# Patient Record
Sex: Male | Born: 2000 | Race: White | Hispanic: No | Marital: Single | Smoking: Never smoker
Health system: Southern US, Community
[De-identification: ages and names within clinical notes are randomized; demographics above are authoritative.]

## PROBLEM LIST (undated history)

## (undated) DIAGNOSIS — R519 Headache, unspecified: Secondary | ICD-10-CM

## (undated) DIAGNOSIS — F32A Depression, unspecified: Secondary | ICD-10-CM

## (undated) DIAGNOSIS — J189 Pneumonia, unspecified organism: Secondary | ICD-10-CM

## (undated) DIAGNOSIS — J45909 Unspecified asthma, uncomplicated: Secondary | ICD-10-CM

## (undated) DIAGNOSIS — F419 Anxiety disorder, unspecified: Secondary | ICD-10-CM

## (undated) DIAGNOSIS — R0600 Dyspnea, unspecified: Secondary | ICD-10-CM

## (undated) HISTORY — PX: OTHER SURGICAL HISTORY: SHX169

---
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
## 2019-09-28 ENCOUNTER — Other Ambulatory Visit: Payer: Self-pay | Admitting: Pediatrics

## 2019-09-28 ENCOUNTER — Other Ambulatory Visit: Payer: Self-pay

## 2019-09-28 ENCOUNTER — Ambulatory Visit
Admission: RE | Admit: 2019-09-28 | Discharge: 2019-09-28 | Disposition: A | Discharge status: Home / Self Care | Payer: Medicaid Other | Attending: Pediatrics | Admitting: Pediatrics

## 2019-09-28 ENCOUNTER — Ambulatory Visit
Admission: RE | Admit: 2019-09-28 | Discharge: 2019-09-28 | Disposition: A | Discharge status: Home / Self Care | Payer: Medicaid Other | Source: Ambulatory Visit | Attending: Pediatrics | Admitting: Pediatrics

## 2019-09-28 DIAGNOSIS — R0600 Dyspnea, unspecified: Secondary | ICD-10-CM

## 2019-09-28 DIAGNOSIS — J4531 Mild persistent asthma with (acute) exacerbation: Secondary | ICD-10-CM

## 2019-12-02 ENCOUNTER — Institutional Professional Consult (permissible substitution): Payer: Medicaid Other | Admitting: Pulmonary Disease

## 2020-07-13 ENCOUNTER — Ambulatory Visit
Admission: RE | Admit: 2020-07-13 | Discharge: 2020-07-13 | Disposition: A | Discharge status: Home / Self Care | Payer: Medicaid Other | Source: Ambulatory Visit | Attending: Pediatrics | Admitting: Pediatrics

## 2020-07-13 ENCOUNTER — Ambulatory Visit
Admission: RE | Admit: 2020-07-13 | Discharge: 2020-07-13 | Disposition: A | Discharge status: Home / Self Care | Payer: Medicaid Other | Attending: Pediatrics | Admitting: Pediatrics

## 2020-07-13 ENCOUNTER — Other Ambulatory Visit: Payer: Self-pay | Admitting: Pediatrics

## 2020-07-13 DIAGNOSIS — M79644 Pain in right finger(s): Secondary | ICD-10-CM | POA: Insufficient documentation

## 2021-11-26 ENCOUNTER — Other Ambulatory Visit: Payer: Self-pay

## 2021-11-26 ENCOUNTER — Ambulatory Visit
Admission: EM | Admit: 2021-11-26 | Discharge: 2021-11-26 | Disposition: A | Discharge status: Home / Self Care | Payer: Medicaid Other | Attending: Emergency Medicine | Admitting: Emergency Medicine

## 2021-11-26 DIAGNOSIS — N451 Epididymitis: Secondary | ICD-10-CM | POA: Diagnosis not present

## 2021-11-26 MED ORDER — DOXYCYCLINE HYCLATE 100 MG PO CAPS: 100.0000 mg | ORAL_CAPSULE | Freq: Two times a day (BID) | ORAL | 0 refills | Status: DC

## 2021-11-26 MED ORDER — IBUPROFEN 600 MG PO TABS: 600.0000 mg | ORAL_TABLET | Freq: Four times a day (QID) | ORAL | 0 refills | Status: DC | PRN

## 2021-11-26 NOTE — Discharge Instructions (Signed)
Take the doxycycline twice daily for 7 days for treatment of your epididymitis.  Use over-the-counter Tylenol and ibuprofen as needed for discomfort.  Wear supportive underwear or jockstrap to support your scrotum and prevent further irritation of your epididymis.  If your symptoms do not improve you need to follow-up with urology.  Narcotic information has been included in your discharge instructions.  

## 2021-11-26 NOTE — ED Provider Notes (Signed)
[facility]    CSN: [id number] Arrival date & time: 11/26/21  1357      History   Chief Complaint Chief Complaint  Patient presents with   Testicle Pain    HPI EVERT WENRICH is a 21 y.o. male.   HPI  21 year old male here for evaluation of right testicular pain.  He noticed an odd feeling in his spermatic cord behind his right testicle 3 days ago.  He states that he had a short episode of pain that lasted approximately 3 hours and resolved earlier today.  He denies any scrotal swelling, painful urination, urinary urgency or frequency, or penile discharge.  He is not sexually active and not concerned about STIs.  He just wanted to be checked because he felt a hard ball on the cord leading to his testicle.  History reviewed. No pertinent past medical history.  There are no problems to display for this patient.   History reviewed. No pertinent surgical history.     Home Medications    Prior to Admission medications   Medication Sig Start Date End Date Taking? Authorizing Provider  doxycycline (VIBRAMYCIN) 100 MG capsule Take 1 capsule (100 mg total) by mouth 2 (two) times daily. 11/26/21  Yes Becky Augusta, NP  ibuprofen (ADVIL) 600 MG tablet Take 1 tablet (600 mg total) by mouth every 6 (six) hours as needed. 11/26/21  Yes Becky Augusta, NP    Family History History reviewed. No pertinent family history.  Social History Social History   Tobacco Use   Smoking status: Never   Smokeless tobacco: Never  Vaping Use   Vaping Use: Never used  Substance Use Topics   Alcohol use: Yes   Drug use: Yes     Allergies   Peanut (diagnostic) and Penicillins   Review of Systems Review of Systems  Genitourinary:  Negative for dysuria, frequency, penile discharge, penile pain, penile swelling, scrotal swelling, testicular pain and urgency.       "Odd sensation" in the cord leading to the right testicle.    Physical Exam Triage Vital Signs ED Triage Vitals   Enc Vitals Group     BP 11/26/21 1626 134/85     Pulse Rate 11/26/21 1626 69     Resp 11/26/21 1626 18     Temp 11/26/21 1626 99 F (37.2 C)     Temp Source 11/26/21 1626 Oral     SpO2 11/26/21 1626 99 %     Weight 11/26/21 1623 145 lb (65.8 kg)     Height 11/26/21 1623 5\' 11"  (1.803 m)     Head Circumference --      Peak Flow --      Pain Score 11/26/21 1622 4     Pain Loc --      Pain Edu? --      Excl. in GC? --    No data found.  Updated Vital Signs BP 134/85 (BP Location: Left Arm)    Pulse 69    Temp 99 F (37.2 C) (Oral)    Resp 18    Ht 5\' 11"  (1.803 m)    Wt 145 lb (65.8 kg)    SpO2 99%    BMI 20.22 kg/m   Visual Acuity Right Eye Distance:   Left Eye Distance:   Bilateral Distance:    Right Eye Near:   Left Eye Near:    Bilateral Near:     Physical Exam Vitals and nursing note reviewed.  Constitutional:  General: He is not in acute distress.    Appearance: Normal appearance. He is not ill-appearing.  HENT:     Head: Normocephalic and atraumatic.  Genitourinary:    Penis: Normal.      Testes: Normal.  Skin:    General: Skin is warm and dry.     Capillary Refill: Capillary refill takes less than 2 seconds.     Findings: No erythema or rash.  Neurological:     General: No focal deficit present.     Mental Status: He is alert and oriented to person, place, and time.  Psychiatric:        Mood and Affect: Mood normal.        Behavior: Behavior normal.        Thought Content: Thought content normal.        Judgment: Judgment normal.     UC Treatments / Results  Labs (all labs ordered are listed, but only abnormal results are displayed) Labs Reviewed - No data to display  EKG   Radiology No results found.  Procedures Procedures (including critical care time)  Medications Ordered in UC Medications - No data to display  Initial Impression / Assessment and Plan / UC Course  I have reviewed the triage vital signs and the nursing  notes.  Pertinent labs & imaging results that were available during my care of the patient were reviewed by me and considered in my medical decision making (see chart for details).  Patient is a nontoxic-appearing 21 year old male here for evaluation of right testicular issues as outlined in HPI above.  He states that he is had an odd sensation for the last couple of days that ended up being a 3 to 4-hour period of pain today.  The pain has resolved.  He states the pain is more in the back of the testicle and in the spermatic cord on the right.  He states he felt a ball along the cord earlier today.  He denies any other urinary complaints and is not currently sexually active.  On exam both of the patient's testicles are normal in size and smooth in texture.  There is some mild tenderness with palpation of the right testicle.  Both the left and right epididymis are inflamed and mildly tender to palpation.  There is no bleeding when palpating the spermatic cord of either testicle.  No redness or swelling of the scrotum.  Patient exam is consistent with epididymitis.  We will place patient on doxycycline twice daily for 7 days and given prescription ibuprofen 6 mg to take every 6 hours as needed for pain.  Have also discussed with him wearing supportive underwear to take the tension off of the spermatic cord and the testicles so that he can rest and heal.   Final Clinical Impressions(s) / UC Diagnoses   Final diagnoses:  Epididymitis     Discharge Instructions      Take the doxycycline twice daily for 7 days for treatment of your epididymitis.  Use over-the-counter Tylenol and ibuprofen as needed for discomfort.  Wear supportive underwear or jockstrap to support your scrotum and prevent further irritation of your epididymis.  If your symptoms do not improve you need to follow-up with urology.  Narcotic information has been included in your discharge instructions.      ED Prescriptions      Medication Sig Dispense Auth. Provider   doxycycline (VIBRAMYCIN) 100 MG capsule Take 1 capsule (100 mg total) by mouth 2 (two) times  daily. 20 capsule Becky Augusta, NP   ibuprofen (ADVIL) 600 MG tablet Take 1 tablet (600 mg total) by mouth every 6 (six) hours as needed. 30 tablet Becky Augusta, NP      PDMP not reviewed this encounter.   Becky Augusta, NP 11/26/21 [phone]

## 2021-11-26 NOTE — ED Triage Notes (Addendum)
Pt c/o pain in his right testicle. Pt states that it is an "odd" feeling. Pt states that he checked his testicle today and felt a lump.

## 2021-12-17 ENCOUNTER — Other Ambulatory Visit (HOSPITAL_COMMUNITY): Payer: Self-pay | Admitting: Infectious Diseases

## 2021-12-17 ENCOUNTER — Other Ambulatory Visit: Payer: Self-pay | Admitting: Infectious Diseases

## 2021-12-17 DIAGNOSIS — N50812 Left testicular pain: Secondary | ICD-10-CM

## 2021-12-17 DIAGNOSIS — N50811 Right testicular pain: Secondary | ICD-10-CM

## 2021-12-17 DIAGNOSIS — Z0000 Encounter for general adult medical examination without abnormal findings: Secondary | ICD-10-CM

## 2021-12-18 ENCOUNTER — Other Ambulatory Visit: Payer: Self-pay

## 2021-12-18 ENCOUNTER — Ambulatory Visit: Payer: Medicaid Other

## 2021-12-18 ENCOUNTER — Ambulatory Visit
Admission: RE | Admit: 2021-12-18 | Discharge: 2021-12-18 | Disposition: A | Discharge status: Home / Self Care | Payer: Medicaid Other | Source: Ambulatory Visit | Attending: Infectious Diseases | Admitting: Infectious Diseases

## 2021-12-18 DIAGNOSIS — Z0000 Encounter for general adult medical examination without abnormal findings: Secondary | ICD-10-CM | POA: Diagnosis present

## 2021-12-18 DIAGNOSIS — N50811 Right testicular pain: Secondary | ICD-10-CM | POA: Insufficient documentation

## 2021-12-18 DIAGNOSIS — N50812 Left testicular pain: Secondary | ICD-10-CM | POA: Insufficient documentation

## 2021-12-24 ENCOUNTER — Other Ambulatory Visit: Payer: Self-pay | Admitting: *Deleted

## 2021-12-24 ENCOUNTER — Other Ambulatory Visit: Payer: Self-pay

## 2021-12-24 ENCOUNTER — Ambulatory Visit: Payer: Medicaid Other | Admitting: Urology

## 2021-12-24 ENCOUNTER — Other Ambulatory Visit
Admission: RE | Admit: 2021-12-24 | Discharge: 2021-12-24 | Disposition: A | Discharge status: Home / Self Care | Payer: Medicaid Other | Attending: Urology | Admitting: Urology

## 2021-12-24 ENCOUNTER — Encounter: Payer: Self-pay | Admitting: Urology

## 2021-12-24 VITALS — BP 115/69 | HR 60 | Ht 71.0 in | Wt 129.0 lb

## 2021-12-24 DIAGNOSIS — N50812 Left testicular pain: Secondary | ICD-10-CM

## 2021-12-24 DIAGNOSIS — N50811 Right testicular pain: Secondary | ICD-10-CM | POA: Diagnosis not present

## 2021-12-24 DIAGNOSIS — N5082 Scrotal pain: Secondary | ICD-10-CM | POA: Diagnosis not present

## 2021-12-24 LAB — URINALYSIS, COMPLETE (UACMP) WITH MICROSCOPIC
Bilirubin Urine: NEGATIVE
Glucose, UA: NEGATIVE mg/dL
Hgb urine dipstick: NEGATIVE
Ketones, ur: NEGATIVE mg/dL
Leukocytes,Ua: NEGATIVE
Nitrite: NEGATIVE
Protein, ur: NEGATIVE mg/dL
Specific Gravity, Urine: 1.01 (ref 1.005–1.030)
pH: 6 (ref 5.0–8.0)

## 2021-12-24 MED ORDER — CELECOXIB 200 MG PO CAPS: 200.0000 mg | ORAL_CAPSULE | Freq: Two times a day (BID) | ORAL | 0 refills | Status: DC

## 2021-12-24 NOTE — Progress Notes (Signed)
° °  12/24/21 2:25 PM   Ricardo Gibson Nov 11, 2000 [id number]  CC: Scrotal pain  HPI: Healthy 21 year old male who reports with a few weeks of scrotal pain.  This originally started on the right, but has moved back and forth from the left to the right.  He was seen in urgent care on 11/26/2021 and scrotal ultrasound was benign  and he was treated with doxycycline for possible epididymitis.  He has continued to have intermittent discomfort in the testicles.  There are no aggravating or alleviating factors.  Urinalysis today is benign  PMH: No past medical history on file.  Surgical History: No past surgical history on file.   Social History:  reports that he has never smoked. He has never been exposed to tobacco smoke. He has never used smokeless tobacco. He reports current alcohol use. He reports current drug use.  Physical Exam: BP 115/69    Pulse 60    Ht 5\' 11"  (1.803 m)    Wt 129 lb (58.5 kg)    BMI 17.99 kg/m    Constitutional:  Alert and oriented, No acute distress. Cardiovascular: No clubbing, cyanosis, or edema. Respiratory: Normal respiratory effort, no increased work of breathing. GI: Abdomen is soft, nontender, nondistended, no abdominal masses GU: Circumcised phallus with patent meatus, no lesions, testicles 20 cc and descended bilaterally, minimally tender, no masses  Laboratory Data: Reviewed, see HPI  Pertinent Imaging: I have personally viewed and interpreted the scrotal ultrasound showing normal-appearing testicles bilaterally with no masses or other abnormalities.  Assessment & Plan:   21 year old male with intermittent bilateral scrotal pain of unclear etiology.  Scrotal ultrasound and urinalysis benign, and was previously treated with a course of doxycycline for possible epididymitis without significant improvement.  We discussed possible causes including pelvic floor dysfunction, nerve irritation, or idiopathic.  I recommended Celebrex 200 mg twice daily x2  weeks, snug fitting underwear, and behavioral strategies discussed.  Return precautions reviewed   35, MD 12/24/2021  [facility] [address], [address] [place], [place] [id number]

## 2021-12-24 NOTE — Patient Instructions (Signed)
Pelvic Pain, Male Pelvic pain is pain in your lower abdomen, below your belly button and between your hips. The pain may start suddenly (be acute), keep coming back (recur), or last a long time (become chronic). Pelvic pain that lasts longer than six months is considered chronic. There are many possible causes of pelvic pain. Sometimes, the cause is not known. Pelvic pain may affect your: Prostate gland. Urinary system. Digestive tract. Musculoskeletal system. Strained muscles or ligaments may cause pelvic pain. Follow these instructions at home: Medicines Take over-the-counter and prescription medicines only as told by your health care provider. If you were prescribed an antibiotic medicine, take it as told by your health care provider. Do not stop taking the antibiotic even if you start to feel better. Managing pain, stiffness, and swelling  Take warm water baths (sitz baths). Sitz baths help with relaxing your pelvic floor muscles. For a sitz bath, the water only comes up to your hips and covers your buttocks. A sitz bath may done at home in a bathtub or with a portable sitz bath that fits over the toilet. If directed, apply heat to the affected area before you exercise. Use the heat source that your health care provider recommends, such as a moist heat pack or a heating pad. Place a towel between your skin and the heat source. Leave the heat on for 20-30 minutes. Remove the heat if your skin turns bright red. This is especially important if you are unable to feel pain, heat, or cold. You may have a greater risk of getting burned. General instructions Rest as told by your health care provider. Keep a journal of your pelvic pain. Write down: When the pain started. Where the pain is located. What seems to make the pain better or worse. Any symptoms you have along with the pain. Follow your treatment plan as told by your health care provider. This may include: Pelvic physical  therapy. Yoga, meditation, and exercise. Biofeedback. This process trains you to manage your body's response (physiological response) through breathing techniques and relaxation methods. You will work with a therapist while machines are used to monitor your physical symptoms. Acupuncture. This is a type of treatment that involves stimulating specific points on your body by inserting thin needles through your skin to treat pain. Keep all follow-up visits as told by your health care provider. This is important. Contact a health care provider if: Medicine does not help your pain. Your pain comes back. You have new symptoms. You have a fever or chills. You are constipated. You have blood in your urine or stool. You feel weak or light-headed. Get help right away if: You have sudden severe pain. Your pain steadily gets worse. You have severe pain along with fever, nausea, vomiting, or excessive sweating. Summary Pelvic pain is pain in your lower abdomen, below your belly button and between your hips. There are many possible causes of pelvic pain. Sometimes, the cause is not known. Take over-the-counter and prescription medicines only as told by your health care provider. If you were prescribed an antibiotic medicine, take it as told by your health care provider. Do not stop taking the antibiotic even if you start to feel better. Contact a health care provider if you have new or worsening symptoms. Get help right away if you have severe pain along with fever, nausea, vomiting, or excessive sweating. Keep all follow-up visits as told by your health care provider. This is important. This information is not intended to replace  advice given to you by your health care provider. Make sure you discuss any questions you have with your health care provider. Document Revised: 03/10/2018 Document Reviewed: 03/10/2018 Elsevier Patient Education  2022 [employer]  Pelvic Floor Dysfunction, Male   Pelvic  floor dysfunction (PFD) is a condition that results when the group of muscles and connective tissues that support the organs in the pelvis (pelvic floor muscles) do not work well. These muscles and their connections form a sling that supports the colon and bladder. In men, these muscles also support the prostate gland. PFD causes pelvic floor muscles to be too weak, too tight, or both. In PFD, muscle movements are not coordinated. This may cause bowel or bladder problems. It may also cause pain. What are the causes? This condition may be caused by an injury to the pelvic area or by a weakening of pelvic muscles. In many cases, the exact cause is not known. What increases the risk? The following factors may make you more likely to develop PFD: Having chronic bladder tissue inflammation (interstitial cystitis). Being an older person. Being overweight. History of radiation treatment for cancer in the pelvic region. Previous pelvic surgery, such as removal of the prostate gland (prostatectomy). What are the signs or symptoms? Symptoms of this condition vary and may include: Bladder symptoms, such as: Trouble starting urination and emptying the bladder. Frequent urinary tract infections. Leaking urine when coughing, laughing, or exercising (stress incontinence). Having to pass urine urgently or frequently. Pain when passing urine. Bowel symptoms, such as: Constipation. Urgent or frequent bowel movements. Incomplete bowel movements. Painful bowel movements. Leaking stool or gas. Unexplained genital or rectal pain. Genital or rectal muscle spasms. Low back pain. Sexual dysfunction, such as erectile dysfunction, premature ejaculation, or pain during or after sexual activity. How is this diagnosed? This condition is diagnosed based on: Your symptoms and medical history. A physical exam. During the exam, your health care provider may check your pelvic muscles for tightness, spasm, pain, or  weakness. This may include a rectal exam. In some cases, you may have diagnostic tests, such as: Electrical muscle function tests. Urine flow testing. X-ray tests of bowel function. Ultrasound of the pelvic organs. How is this treated? Treatment for this condition depends on your symptoms. Treatment options include: Physical therapy. This may include Kegel exercises to help relax or strengthen the pelvic floor muscles. Biofeedback. This type of therapy provides feedback on how tight your pelvic floor muscles are so that you can learn to control them. Massage therapy. A treatment that involves electrical stimulation of the pelvic floor muscles to help control pain (transcutaneous electrical nerve stimulation, or TENS). Sound wave therapy (ultrasound) to reduce muscle spasms. Medicines, such as: Muscle relaxants. Bladder control medicines. Surgery to reconstruct or support pelvic floor muscles may be an option if other treatments do not help. Follow these instructions at home: Activity Do your usual activities as told by your health care provider. Ask your health care provider if you should modify any activities. Do pelvic floor strengthening or relaxing exercises at home as told by your physical therapist. Lifestyle Maintain a healthy weight. Eat foods that are high in fiber, such as beans, whole grains, and fresh fruits and vegetables. Limit foods that are high in fat and processed sugars, such as fried or sweet foods. Manage stress with relaxation techniques such as yoga or meditation. General instructions If you have problems with leakage: Use absorbable pads or wear padded underwear. Wash your genital and anal area  frequently with mild soap. Keep your genital and anal area as clean and dry as possible. Ask your health care provider if you should try a barrier cream to prevent skin irritation. Take warm baths to relieve pelvic muscle tension or spasms. Take over-the-counter and  prescription medicines only as told by your health care provider. Keep all follow-up visits. How is this prevented? The cause of PFD is not always known, but there are a few things you can do to reduce the risk of developing this condition, including: Staying at a healthy weight. Getting regular exercise. Managing stress. Contact a health care provider if: Your symptoms are not improving with home care. You have signs or symptoms of PFD that get worse. You develop new signs or symptoms. You have signs of a urinary tract infection, such as: Fever. Chills. Increased urinary frequency. A burning feeling when urinating. You have not had a bowel movement in 3 days (constipation). Summary Pelvic floor dysfunction results when the muscles and connective tissues in your pelvic floor do not work well. These muscles and their connections form a sling that supports your colon and bladder. In men, these muscles also support the prostate gland. PFD may be caused by an injury to the pelvic area or by a weakening of pelvic muscles. PFD causes pelvic floor muscles to be too weak, too tight, or a combination of both. Symptoms may vary from person to person. In most cases, PFD can be treated with physical therapies and medicines. Surgery may be an option if other treatments do not help. This information is not intended to replace advice given to you by your health care provider. Make sure you discuss any questions you have with your health care provider. Document Revised: 02/27/2021 Document Reviewed: 02/27/2021 Elsevier Patient Education  2022 [employer].

## 2022-01-07 ENCOUNTER — Other Ambulatory Visit: Payer: Self-pay

## 2022-01-07 DIAGNOSIS — N50812 Left testicular pain: Secondary | ICD-10-CM

## 2022-01-07 DIAGNOSIS — N50811 Right testicular pain: Secondary | ICD-10-CM

## 2022-01-07 DIAGNOSIS — N5082 Scrotal pain: Secondary | ICD-10-CM

## 2022-01-07 NOTE — Telephone Encounter (Signed)
Please order CT abdomen and pelvis with contrast to evaluate for other etiologies of his multiple symptoms.  We will call with CT results ? ?Legrand Rams, MD ?01/07/2022 ? ?

## 2022-01-21 ENCOUNTER — Ambulatory Visit: Payer: Medicaid Other | Admitting: Urology

## 2022-01-21 ENCOUNTER — Other Ambulatory Visit: Payer: Self-pay

## 2022-01-21 ENCOUNTER — Encounter: Payer: Self-pay | Admitting: Urology

## 2022-01-21 VITALS — BP 130/71 | HR 73 | Ht 71.0 in | Wt 127.6 lb

## 2022-01-21 DIAGNOSIS — M6289 Other specified disorders of muscle: Secondary | ICD-10-CM

## 2022-01-21 MED ORDER — CELECOXIB 200 MG PO CAPS: 200.0000 mg | ORAL_CAPSULE | Freq: Two times a day (BID) | ORAL | 0 refills | Status: DC

## 2022-01-21 NOTE — Patient Instructions (Signed)
Pelvic Floor Dysfunction, Male ?  ?Pelvic floor dysfunction (PFD) is a condition that results when the group of muscles and connective tissues that support the organs in the pelvis (pelvic floor muscles) do not work well. These muscles and their connections form a sling that supports the colon and bladder. In men, these muscles also support the prostate gland. ?PFD causes pelvic floor muscles to be too weak, too tight, or both. In PFD, muscle movements are not coordinated. This may cause bowel or bladder problems. It may also cause pain. ?What are the causes? ?This condition may be caused by an injury to the pelvic area or by a weakening of pelvic muscles. In many cases, the exact cause is not known. ?What increases the risk? ?The following factors may make you more likely to develop PFD: ?Having chronic bladder tissue inflammation (interstitial cystitis). ?Being an older person. ?Being overweight. ?History of radiation treatment for cancer in the pelvic region. ?Previous pelvic surgery, such as removal of the prostate gland (prostatectomy). ?What are the signs or symptoms? ?Symptoms of this condition vary and may include: ?Bladder symptoms, such as: ?Trouble starting urination and emptying the bladder. ?Frequent urinary tract infections. ?Leaking urine when coughing, laughing, or exercising (stress incontinence). ?Having to pass urine urgently or frequently. ?Pain when passing urine. ?Bowel symptoms, such as: ?Constipation. ?Urgent or frequent bowel movements. ?Incomplete bowel movements. ?Painful bowel movements. ?Leaking stool or gas. ?Unexplained genital or rectal pain. ?Genital or rectal muscle spasms. ?Low back pain. ?Sexual dysfunction, such as erectile dysfunction, premature ejaculation, or pain during or after sexual activity. ?How is this diagnosed? ?This condition is diagnosed based on: ?Your symptoms and medical history. ?A physical exam. During the exam, your health care provider may check your pelvic  muscles for tightness, spasm, pain, or weakness. This may include a rectal exam. ?In some cases, you may have diagnostic tests, such as: ?Electrical muscle function tests. ?Urine flow testing. ?X-ray tests of bowel function. ?Ultrasound of the pelvic organs. ?How is this treated? ?Treatment for this condition depends on your symptoms. Treatment options include: ?Physical therapy. This may include Kegel exercises to help relax or strengthen the pelvic floor muscles. ?Biofeedback. This type of therapy provides feedback on how tight your pelvic floor muscles are so that you can learn to control them. ?Massage therapy. ?A treatment that involves electrical stimulation of the pelvic floor muscles to help control pain (transcutaneous electrical nerve stimulation, or TENS). ?Sound wave therapy (ultrasound) to reduce muscle spasms. ?Follow these instructions at home: ?Activity ?Do your usual activities as told by your health care provider. Ask your health care provider if you should modify any activities. ?Do pelvic floor strengthening or relaxing exercises at home as told by your physical therapist. ?Lifestyle ?Maintain a healthy weight. ?Eat foods that are high in fiber, such as beans, whole grains, and fresh fruits and vegetables. ?Limit foods that are high in fat and processed sugars, such as fried or sweet foods. ?Manage stress with relaxation techniques such as yoga or meditation. ?General instructions ?If you have problems with leakage: ?Use absorbable pads or wear padded underwear. ?Wash your genital and anal area frequently with mild soap. ?Keep your genital and anal area as clean and dry as possible. ?Ask your health care provider if you should try a barrier cream to prevent skin irritation. ?Take warm baths to relieve pelvic muscle tension or spasms. ?Take over-the-counter and prescription medicines only as told by your health care provider. ?Keep all follow-up visits. ?How is this prevented? ?  The cause of PFD is  not always known, but there are a few things you can do to reduce the risk of developing this condition, including: ?Staying at a healthy weight. ?Getting regular exercise. ?Managing stress. ?Contact a health care provider if: ?Your symptoms are not improving with home care. ?You have signs or symptoms of PFD that get worse. ?You develop new signs or symptoms. ?You have signs of a urinary tract infection, such as: ?Fever. ?Chills. ?Increased urinary frequency. ?A burning feeling when urinating. ?You have not had a bowel movement in 3 days (constipation). ?Summary ?Pelvic floor dysfunction results when the muscles and connective tissues in your pelvic floor do not work well. ?These muscles and their connections form a sling that supports your colon and bladder. In men, these muscles also support the prostate gland. ?PFD may be caused by an injury to the pelvic area or by a weakening of pelvic muscles. ?PFD causes pelvic floor muscles to be too weak, too tight, or a combination of both. Symptoms may vary from person to person. ?In most cases, PFD can be treated with physical therapies and medicines. Surgery may be an option if other treatments do not help. ?This information is not intended to replace advice given to you by your health care provider. Make sure you discuss any questions you have with your health care provider. ?Document Revised: 02/27/2021 Document Reviewed: 02/27/2021 ?Elsevier Patient Education ? 2022 [employer] ? ?Pelvic Pain, Male ?Pelvic pain is pain in your lower abdomen, below your belly button and between your hips. The pain may start suddenly (be acute), keep coming back (recur), or last a long time (become chronic). Pelvic pain that lasts longer than six months is considered chronic. There are many possible causes of pelvic pain. Sometimes, the cause is not known. ?Pelvic pain may affect your: ?Prostate gland. ?Urinary system. ?Digestive tract. ?Musculoskeletal system. Strained muscles or  ligaments may cause pelvic pain. ?Follow these instructions at home: ?Medicines ?Take over-the-counter and prescription medicines only as told by your health care provider. ?If you were prescribed an antibiotic medicine, take it as told by your health care provider. Do not stop taking the antibiotic even if you start to feel better. ?Managing pain, stiffness, and swelling ? ?Take warm water baths (sitz baths). Sitz baths help with relaxing your pelvic floor muscles. ?For a sitz bath, the water only comes up to your hips and covers your buttocks. A sitz bath may done at home in a bathtub or with a portable sitz bath that fits over the toilet. ?If directed, apply heat to the affected area before you exercise. Use the heat source that your health care provider recommends, such as a moist heat pack or a heating pad. ?Place a towel between your skin and the heat source. ?Leave the heat on for 20-30 minutes. ?Remove the heat if your skin turns bright red. This is especially important if you are unable to feel pain, heat, or cold. You may have a greater risk of getting burned. ?General instructions ?Rest as told by your health care provider. ?Keep a journal of your pelvic pain. Write down: ?When the pain started. ?Where the pain is located. ?What seems to make the pain better or worse. ?Any symptoms you have along with the pain. ?Follow your treatment plan as told by your health care provider. This may include: ?Pelvic physical therapy. ?Yoga, meditation, and exercise. ?Biofeedback. This process trains you to manage your body's response (physiological response) through breathing techniques and relaxation  methods. You will work with a therapist while machines are used to monitor your physical symptoms. ?Acupuncture. This is a type of treatment that involves stimulating specific points on your body by inserting thin needles through your skin to treat pain. ?Keep all follow-up visits as told by your health care provider. This  is important. ?Contact a health care provider if: ?Medicine does not help your pain. ?Your pain comes back. ?You have new symptoms. ?You have a fever or chills. ?You are constipated. ?You have blood i

## 2022-01-21 NOTE — Progress Notes (Signed)
? ?  01/21/2022 ?4:07 PM  ? ?Ricardo Gibson ?06/03/2001 ?[id number] ? ?Reason for visit: Follow up pelvic pain ? ?HPI: ?Healthy 21 year old male who I last saw on 12/24/2021 for pelvic and scrotal pain.  He was seen in urgent care in January 2023 and scrotal ultrasound was benign.  We tried a course of Celebrex, and he had some improvement on this medication, but after discontinuing the medication noticed recurrence of his symptoms.  He feels like this is preventing him from working at this point.  He now admits that his pelvic pain started immediately after using a sex doll.  He is here with his dad today.  His primary complaint is pelvic pain at the base of the penis, and right and left groin pain that changes intermittently and can go down to the testicle.  He also reports some urinary symptoms of postvoid dribbling.  Pain seems to be worse when he is physically active and gets up or changes position. ? ?On exam, phallus without lesions, testicles 20 cc and descended bilaterally without masses, nontender, tenderness at the lower abdomen and base of the penis on exam, no skin lesions. ? ?We had previously discussed considering a CT scan, but this was not approved by insurance. ? ?We discussed his likely diagnosis of pelvic floor dysfunction, and extensive patient information provided.  I also placed a referral to pelvic floor physical therapy, and recommended starting with Epsom salt sitz bath, snug fitting underwear, and 2 additional weeks of the Celebrex 200 mg twice daily. ? ?RTC 3 months symptom check ? ?Billey Co, MD ? ?[facility] ?[address], [address] ?[place], [place] [address] ?[phone] ? ? ?

## 2022-01-24 ENCOUNTER — Ambulatory Visit: Payer: Medicaid Other

## 2022-02-04 ENCOUNTER — Encounter: Payer: Self-pay | Admitting: Physical Therapy

## 2022-02-04 ENCOUNTER — Ambulatory Visit: Payer: Medicaid Other | Attending: Urology | Admitting: Physical Therapy

## 2022-02-04 DIAGNOSIS — R102 Pelvic and perineal pain unspecified side: Secondary | ICD-10-CM

## 2022-02-04 DIAGNOSIS — M6289 Other specified disorders of muscle: Secondary | ICD-10-CM | POA: Diagnosis not present

## 2022-02-04 DIAGNOSIS — M62838 Other muscle spasm: Secondary | ICD-10-CM | POA: Diagnosis present

## 2022-02-04 DIAGNOSIS — R278 Other lack of coordination: Secondary | ICD-10-CM

## 2022-02-04 NOTE — Therapy (Signed)
?OUTPATIENT PHYSICAL THERAPY MALE PELVIC EVALUATION ? ? ?Patient Name: Ricardo Gibson ?MRN: [id number] ?DOB:2001/05/24, 21 y.o., male ?Today's Date: 02/04/2022 ? ? PT End of Session - 02/04/22 1124   ? ? Visit Number 1   ? Number of Visits 8   ? Date for PT Re-Evaluation 04/01/22   ? PT Start Time 1120   ? PT Stop Time 1200   ? PT Time Calculation (min) 40 min   ? Activity Tolerance Patient tolerated treatment well   ? Behavior During Therapy [facility] for tasks assessed/performed   ? ?  ?  ? ?  ? ? ?History reviewed. No pertinent past medical history. ?History reviewed. No pertinent surgical history. ?There are no problems to display for this patient. ? ? ?PCP: Leonel Ramsay, MD ? ?REFERRING PROVIDER: Billey Co, MD ? ?REFERRING DIAG:  ?M62.89 (ICD-10-CM) - Pelvic floor dysfunction ? ? ? ?THERAPY DIAG:  ?Other muscle spasm ? ?Other lack of coordination ? ?Pelvic pain ? ?ONSET DATE: 11/26/2021 ? ?SUBJECTIVE:                                                                                                                                                                                          ? ?SUBJECTIVE STATEMENT: ?*Patient notes he has had about 15 lbs of unexplained weight loss over the past 2-3 months.  ?*Patient had one instance of loss of bowel after elbowing self accidentally in lower region.  ?Patient states that presently is pelvic pain results he has random spurts of urinary urgency. Patient notes that there is consistent pain where some thing right above the groin feels as if it is being grabbed. He does have some stabbing pain. Patient also notes he has difficulty and increased pain with leg crossing. Patient is unable to work because of intolerance to standing; pain reaches an intensity that he is unable to tolerate after ~ 30- 40 minutes. Patient notes occasional low back pain.  ? ?Fluid intake: water ~ 120 oz; coffee 2 cups; milk 1 cup ? ? ?PAIN:  ?Are you having pain? Yes ?NPRS scale: 7/10 ?Least:  4/10 (no explanation/relieving factors) ?Worst: 7/10 ?Pain location:  suprapubic (pain can migrate throughout the groin and patient states this is situationally dependent) ? ?Pain type: tight, tingling/stabbing, and grabbing ?Pain description: constant (average 5-6/10) ? ?Aggravating factors: standing, crossing legs, wide stance, unilateral stretches ?Relieving factors: sitting down; resting ? ?PRECAUTIONS: None ? ?WEIGHT BEARING RESTRICTIONS No ? ?FALLS:  ?Has patient fallen in last 6 months? No ? ?OCCUPATION: standing job; not currently working ? ?PLOF: Independent ? ?PATIENT GOALS: "go back to my regular bathroom schedule"  and "being able to stand for more than 30 minutes without pain" ? ?PERTINENT HISTORY:  ?Scrotal ultrasound benign; urinalysis benign; "On exam, phallus without lesions, testicles 20 cc and descended bilaterally without masses, nontender, tenderness at the lower abdomen and base of the penis on exam, no skin lesions" MD note 01/21/2022 ? ?BOWEL MOVEMENT ?Pain with bowel movement: Patient is unsure; but cannot recall a specific incident of pain. Has pain with increased fecal urge. ?Type of bowel movement:Type (Bristol Stool Scale) 6-7, Frequency 1x, and Strain Yes (rarely) ?Fully empty rectum: No ?Leakage: No ? ?URINATION ?Pain with urination: Yes (onset within the last 2 weeks) ?Fully empty bladder: No ?Stream: Strong ?Urgency: No ?Frequency: every 2 hours ?Leakage:  post-void ? ? ? ?OBJECTIVE: deferred 2/2 to time constraints ? ?PATIENT SURVEYS:  ?FOTO PFDI Pain 58 ? ? ?COGNITION: ? Overall cognitive status: Within functional limits for tasks assessed   ?  ?SENSATION: ?not formally assessed  ? ?MUSCLE LENGTH: ?not formally assessed  ? ?GAIT: ?Patient ambulates with rounded posture and forward head. No gross abnormalities noted on brief assessment from lobby.  ? ?POSTURE:  ?Patient with dynamic seated posture; frequent position changes. Posteriorly tilted pelvis and increased thoracic kyphosis.  Not formally assessed for pelvic asymmetries 2/2 to time constraints. Patient does appear to have excessive ROM at UE and throughout spine which may be contributing to frequent position changes. ? ?LUMBARAROM/PROM ? ?A/PROM A/PROM  ?02/04/2022  ?Flexion   ?Extension   ?Right lateral flexion   ?Left lateral flexion   ?Right rotation   ?Left rotation   ? (Blank rows = not tested) ? ?LE AROM/PROM: ? ?A/PROM Right ?02/04/2022 Left ?02/04/2022  ?Hip flexion    ?Hip extension    ?Hip abduction    ?Hip adduction    ?Hip internal rotation    ?Hip external rotation    ?Knee flexion    ?Knee extension    ?Ankle dorsiflexion    ?Ankle plantarflexion    ?Ankle inversion    ?Ankle eversion    ? (Blank rows = not tested) ? ?LE MMT: ? ?MMT Right ?02/04/2022 Left ?02/04/2022  ?Hip flexion    ?Hip extension    ?Hip abduction    ?Hip adduction    ?Hip internal rotation    ?Hip external rotation    ?Knee flexion    ?Knee extension    ?Ankle dorsiflexion    ?Ankle plantarflexion    ?Ankle inversion    ?Ankle eversion    ? ?PELVIC MMT: ?  ?MMT  ?02/04/2022  ?External Anal Sphincter   ?Puborectalis   ?Diastasis Recti   ?(Blank rows = not tested) ? ?PALPATION: ?not formally assessed  ? ?TONE: ?not formally assessed  ? ? ?ASSESSMENT: ? ?CLINICAL IMPRESSION: ?Patient is a 21 year old presenting to clinic with chief complaints of pelvic pain lasting > 3 months with both urinary and bowel symptoms. Today's evaluation is suggestive of deficits in PFM coordination, PFM extensibility, posture, and pain as evidenced by constant pelvic pain of 6/10 NPRS average with worst pain 7/10, frequent postural changes and apparent excessive ROM/hypermobility at UE joints and spine, increased pelvic pain in standing > 30 min, pain with hip abduction B, post-void UI, and one instance of complete loss of control of bowels which patient reports was from accidentally elbowing himself. Patient does report a 15 lb unintentional weight loss over the past 2-3 months with Type  6-7 Bristol Stool Chart BMs, but offers that the stress of his current state  may be an explanation for these changes. Patient's responses on PFDI Pain outcome measures (58) indicate significant functional limitations/disability/distress. Patient's progress may be limited due to the ambiguity of his symptoms and work-up to date. Patient answered all questions during the evaluation but answers at times were vague/obtuse, so information gathered may benefit from future clarification. Patient was educated on the functions of PFM and the likely benefits of limiting sexual activity with self/partners as much as possible to decrease prolonged muscle spasm. Patient will benefit from continued skilled therapeutic intervention to address deficits in PFM coordination, PFM extensibility, posture, and pain in order to increase function and improve overall QOL.  ? ? ?OBJECTIVE IMPAIRMENTS decreased activity tolerance, decreased coordination, increased muscle spasms, improper body mechanics, postural dysfunction, and pain.  ? ?ACTIVITY LIMITATIONS community activity, meal prep, occupation, and yard work.  ? ?PERSONAL FACTORS Age, Behavior pattern, Past/current experiences, and Time since onset of injury/illness/exacerbation are also affecting patient's functional outcome.  ? ? ?REHAB POTENTIAL: Fair   ? ?CLINICAL DECISION MAKING: Evolving/moderate complexity ? ?EVALUATION COMPLEXITY: Moderate ? ? ?GOALS: ?Goals reviewed with patient? Yes ? ?LONG TERM GOALS: Target date: 04/01/2022 ? ?Patient will demonstrate independence with HEP in order to maximize therapeutic gains and improve carryover from physical therapy sessions to ADLs in the home and community. ?Baseline: provided with initial stretches ?Goal status: INITIAL ? ?2.  Patient will decrease worst pain as reported on NPRS by at least 2 points to demonstrate clinically significant reduction in pain in order to restore/improve function and overall QOL. ?Baseline: 7/10 ?Goal  status: INITIAL ? ?3.  Patient will demonstrate improved function as evidenced by a score of <25 on FOTO measure for full participation in activities at home and in the community.  ?Baseline: 58 ?Goal status

## 2022-02-11 ENCOUNTER — Encounter: Payer: Self-pay | Admitting: Physical Therapy

## 2022-02-11 ENCOUNTER — Other Ambulatory Visit: Payer: Self-pay | Admitting: Urology

## 2022-02-11 ENCOUNTER — Ambulatory Visit: Payer: Medicaid Other | Admitting: Physical Therapy

## 2022-02-11 ENCOUNTER — Telehealth: Payer: Self-pay

## 2022-02-11 DIAGNOSIS — R102 Pelvic and perineal pain unspecified side: Secondary | ICD-10-CM

## 2022-02-11 DIAGNOSIS — R278 Other lack of coordination: Secondary | ICD-10-CM

## 2022-02-11 DIAGNOSIS — M62838 Other muscle spasm: Secondary | ICD-10-CM | POA: Diagnosis not present

## 2022-02-11 MED ORDER — CYCLOBENZAPRINE HCL 5 MG PO TABS: 5.0000 mg | ORAL_TABLET | Freq: Every day | ORAL | 0 refills | Status: DC

## 2022-02-11 NOTE — Therapy (Signed)
?OUTPATIENT PHYSICAL THERAPY TREATMENT NOTE ? ? ?Patient Name: Ricardo Gibson ?MRN: [id number] ?DOB:11-15-2000, 21 y.o., male ?Today's Date: 02/11/2022 ? ?PCP: Leonel Ramsay, MD ?REFERRING PROVIDER: Billey Co, MD ? ?END OF SESSION:  ? PT End of Session - 02/11/22 1030   ? ? Visit Number 2   ? Number of Visits 8   ? Date for PT Re-Evaluation 04/01/22   ? PT Start Time 1030   ? PT Stop Time 1110   ? PT Time Calculation (min) 40 min   ? Activity Tolerance Patient tolerated treatment well   ? Behavior During Therapy [facility] for tasks assessed/performed   ? ?  ?  ? ?  ? ? ?History reviewed. No pertinent past medical history. ?History reviewed. No pertinent surgical history. ?There are no problems to display for this patient. ? ? ?REFERRING DIAG: M62.89 (ICD-10-CM) - Pelvic floor dysfunction ? ?THERAPY DIAG:  ?Other muscle spasm ? ?Other lack of coordination ? ?Pelvic pain ? ?PERTINENT HISTORY: Scrotal ultrasound benign; urinalysis benign; "On exam, phallus without lesions, testicles 20 cc and descended bilaterally without masses, nontender, tenderness at the lower abdomen and base of the penis on exam, no skin lesions" MD note 01/21/2022 ? ?PRECAUTIONS: None ? ?SUBJECTIVE: Patient states that he continues to have bad abdominal pains but feels the stretches have been helpful for pelvic pain. He reports he is able to walk around more readily. Patient reports that when he did stretches on Sunday he had pain at the R inguinal region. Patient reports that he had to lift something heavier during the day. Patient states that pain calmed down in a little more than 24 hours. Patient did get up too quickly this morning and felt a spasm in the suprapubic area which caused him to vomit.  ? ?PAIN:  ?Are you having pain? Yes: NPRS scale: 5-6/10 ?Pain location: lower abdominal and pelvic region ?Pain description: bruised/squeezed ? ?TREATMENT ? ?Pre-treatment assessment: ?RANGE OF MOTION:  ?  LEFT RIGHT  ?Lumbar forward flexion  (65):  WNL*    ?Lumbar extension (30): WNL    ?Lumbar lateral flexion (25):  WNL WNL*  ?Thoracic and Lumbar rotation (30 degrees):    WNL WNL  ?Hip Flexion (0-125):   WNL* WNL*  ?Hip IR (0-45):  WNL WNL  ?Hip ER (0-45):  WNL WNL  ?Hip Abduction (0-40):  WNL WNL  ? ? ?STRENGTH: MMT  ? RLE LLE  ?Hip Flexion 5 5  ?Hip Abduction  5 5  ?Hip Adduction  5* 5*  ?Hip ER  5 4  ?Hip IR  5 5  ?Knee Extension 5 5  ?Knee Flexion 5 5  ?Patient had audible cavitation of pubic symphysis with hip adduction MMT with pain reaching 7/10 (R>L, but roughly the same.) ? ?ABDOMINAL:  ?Palpation: TTP throughout abdominal region; increased non-neurologic tone throughout abdominal wall including internal and external obliques, rectus abdominus ?Suprapubic percussion: painful ?Diastasis: none noted on exam, < 2 finger breadth throughout ?Rib flare: present B ? ?SPECIAL TESTS: ? ?SLR (SN 92, -LR 0.29): R: Negative L:  Negative ? ?FABER (SN 81): R: Negative L: Negative ?FADIR (SN 94): R: Negative L: Negative ? ? ?EXTERNAL PELVIC EXAM: Patient educated on the purpose of the pelvic exam and articulated understanding; patient consented to the exam verbally. Exam was chaperoned by Valentina Gu, DPT, PT ?Breath coordination: absent ?Perineal Mobility Testing: ?Voluntary Contraction: absent ?Relaxation: non-relaxing ?Perineal movement with sustained increase in IAP (?bear down?): minimal perineal descent; excessive use  of rectus abdominus ?Perineal movement with rapid increase in IAP (?cough?): no perineal movement ? ?Manual Therapy: ? ? ?Neuromuscular Re-education: ?Supine hooklying diaphragmatic breathing with VCs and TCs for downregulation of the nervous system and improved management of IAP ?Supine hooklying trunk rotations with coordinated breath for decreased pain and abdominal tone ?Introduction of pain neuroscience education with supplemental handout provided.  ? ?Therapeutic Exercise: ? ? ?Treatments unbilled: ? ?Post-treatment  assessment: ? ?Patient educated throughout session on appropriate technique and form using multi-modal cueing, HEP, and activity modification. Patient articulated understanding and returned demonstration. ? ?Patient Response to interventions: ?6/10 ? ?ASSESSMENT ?Patient presents to clinic with excellent motivation to participate in therapy. Patient demonstrates deficits in PFM coordination, PFM extensibility, posture, and pain. Patient's physical assessment during today's session revealed MSK contributions to pain experience from both hips and spine. Patient has significantly increased non-neurologic tone throughout the abdominal wall and PFM and was able to achieve a lengthened position of PFM on one instance with significant compensations from the superficial musculature of the abdominal wall. Patient was encouraged to continue with stretches and work on diaphragmatic breathing as part of HEP. Patient will benefit from continued skilled therapeutic intervention to address remaining deficits in PFM coordination, PFM extensibility, posture, and pain in order to increase function and improve overall QOL. ? ? ?OBJECTIVE IMPAIRMENTS decreased activity tolerance, decreased coordination, increased muscle spasms, improper body mechanics, postural dysfunction, and pain.  ? ?ACTIVITY LIMITATIONS community activity, meal prep, occupation, and yard work.  ? ?PERSONAL FACTORS Age, Behavior pattern, Past/current experiences, and Time since onset of injury/illness/exacerbation are also affecting patient's functional outcome.  ? ? ?REHAB POTENTIAL: Fair  ? ?CLINICAL DECISION MAKING: Evolving/moderate complexity ? ?EVALUATION COMPLEXITY: Moderate ? ? ?GOALS: ?Goals reviewed with patient? Yes ? ?LONG TERM GOALS: Target date: 04/01/2022 ? ?Patient will demonstrate independence with HEP in order to maximize therapeutic gains and improve carryover from physical therapy sessions to ADLs in the home and community. ?Baseline: provided  with initial stretches ?Goal status: INITIAL ? ?2.  Patient will decrease worst pain as reported on NPRS by at least 2 points to demonstrate clinically significant reduction in pain in order to restore/improve function and overall QOL. ?Baseline: 7/10 ?Goal status: INITIAL ? ?3.  Patient will demonstrate improved function as evidenced by a score of <25 on FOTO measure for full participation in activities at home and in the community.  ?Baseline: 58 ?Goal status: INITIAL ? ?4.  Patient will demonstrate coordinated lengthening and relaxation of PFM with diaphragmatic inhalation in order to decrease spasm and allow for unrestricted elimination of urine/feces for improved overall QOL. ? ?Baseline: not demonstrated ?Goal status: INITIAL ? ? ? ?PLAN: ?PT FREQUENCY: 1x/week ? ?PT DURATION: 12 weeks ? ?PLANNED INTERVENTIONS: Therapeutic exercises, Therapeutic activity, Neuromuscular re-education, Balance training, Gait training, Patient/Family education, Joint mobilization, Orthotic/Fit training, Electrical stimulation, Spinal manipulation, Spinal mobilization, Cryotherapy, Moist heat, Taping, Biofeedback, and Manual therapy ? ?PLAN FOR NEXT SESSION: PFM downtraining ? ? ?Myles Gip PT, DPT [phone]  ?02/11/2022, 10:30 AM ? ?  ? ?

## 2022-02-11 NOTE — Telephone Encounter (Signed)
-----   Message from Billey Co, MD sent at 02/11/2022 12:47 PM EDT ----- ?Regarding: Flexeril ?Pelvic floor physical therapist recommended a few days of Flexeril for his pelvic floor pain, and I sent the prescription in.  Hopefully this will help, keep follow-up as scheduled ? ?Nickolas Madrid, MD ?02/11/2022 ? ? ? ?

## 2022-02-11 NOTE — Progress Notes (Signed)
Pelvic floor physical therapist recommended considering trial of Flexeril for muscle spasm, 10-day nightly supply sent in ? ?Nickolas Madrid, MD ?02/11/2022 ? ?

## 2022-02-11 NOTE — Telephone Encounter (Signed)
Called pt no answer. LM for pt to call back. Mychart message sent.  ?

## 2022-02-18 ENCOUNTER — Ambulatory Visit: Payer: Medicaid Other | Admitting: Physical Therapy

## 2022-02-18 ENCOUNTER — Encounter: Payer: Self-pay | Admitting: Physical Therapy

## 2022-02-18 DIAGNOSIS — M62838 Other muscle spasm: Secondary | ICD-10-CM

## 2022-02-18 DIAGNOSIS — R278 Other lack of coordination: Secondary | ICD-10-CM

## 2022-02-18 DIAGNOSIS — R102 Pelvic and perineal pain: Secondary | ICD-10-CM

## 2022-02-18 NOTE — Therapy (Signed)
?OUTPATIENT PHYSICAL THERAPY TREATMENT NOTE ? ? ?Patient Name: Ricardo Gibson ?MRN: [id number] ?DOB:12-Jun-2001, 21 y.o., male ?Today's Date: 02/18/2022 ? ?PCP: Mick Sell, MD ?REFERRING PROVIDER: Sondra Come, MD ? ?END OF SESSION:  ? PT End of Session - 02/18/22 0903   ? ? Visit Number 3   ? Number of Visits 8   ? Date for PT Re-Evaluation 04/01/22   ? PT Start Time 0900   ? PT Stop Time 0940   ? PT Time Calculation (min) 40 min   ? Activity Tolerance Patient tolerated treatment well   ? Behavior During Therapy [facility] for tasks assessed/performed   ? ?  ?  ? ?  ? ? ?History reviewed. No pertinent past medical history. ?History reviewed. No pertinent surgical history. ?There are no problems to display for this patient. ? ? ?REFERRING DIAG: M62.89 (ICD-10-CM) - Pelvic floor dysfunction ? ?THERAPY DIAG:  ?Other muscle spasm ? ?Other lack of coordination ? ?Pelvic pain ? ?PERTINENT HISTORY: Scrotal ultrasound benign; urinalysis benign; "On exam, phallus without lesions, testicles 20 cc and descended bilaterally without masses, nontender, tenderness at the lower abdomen and base of the penis on exam, no skin lesions" MD note 01/21/2022 ? ?PRECAUTIONS: None ? ?SUBJECTIVE: Patient reports that he feels the exercises including stretches and belly breathing have been helping to bring pain down. He was able to run up and down the steps with pain present but not intense. And he also reports that the flexeril at bedtime has allowed him to wake up the next day in less pain. Patient does continue to feel as though his bladder is being squeezed; he feels this suprapubically and notes that the sensation limits what types of waistbands he can wear.  ? ?PAIN:  ?Are you having pain? Yes: NPRS scale: 5/10 ?Pain location: lower abdominal and pelvic region ?Pain description: bruised/squeezed ? ?TREATMENT ? ?02/11/2022: ?ABDOMINAL:  ?Palpation: TTP throughout abdominal region; increased non-neurologic tone throughout abdominal wall  including internal and external obliques, rectus abdominus ?Suprapubic percussion: painful ?Diastasis: none noted on exam, < 2 finger breadth throughout ?Rib flare: present B ? ?EXTERNAL PELVIC EXAM: Patient educated on the purpose of the pelvic exam and articulated understanding; patient consented to the exam verbally. Exam was chaperoned by Consuela Mimes, DPT, PT ?Breath coordination: absent ?Perineal Mobility Testing: ?Voluntary Contraction: absent ?Relaxation: non-relaxing ?Perineal movement with sustained increase in IAP (?bear down?): minimal perineal descent; excessive use of rectus abdominus ?Perineal movement with rapid increase in IAP (?cough?): no perineal movement ? ?Manual Therapy: ? ? ?Neuromuscular Re-education: ?Prone diaphragmatic breathing for improved IAP management and nervous system downtraining ?Prone press up with coordinated breath for improved length in abdominal mm and improved posture ?Prone hip extension, SL and DL, with coordinated breath for improved length in anterior chain and improved posture ?Standing lumbar extension at wall with coordinated breath for improved length in anterior chain and improved posture ?Standing hip flexor stretch at chair for improved anterior chain length and posture ? ?Therapeutic Exercise: ? ? ?Treatments unbilled: ? ?Post-treatment assessment: ? ?Patient educated throughout session on appropriate technique and form using multi-modal cueing, HEP, and activity modification. Patient articulated understanding and returned demonstration. ? ?Patient Response to interventions: ?5/10 ? ?ASSESSMENT ?Patient presents to clinic with excellent motivation to participate in therapy. Patient demonstrates deficits in PFM coordination, PFM extensibility, posture, and pain. Patient with improved diaphragmatic breath and good form with prone anterior chain lengthening interventions and required moderate cueing for interventions in postures against gravity.  Patient will  benefit from continued skilled therapeutic intervention to address remaining deficits in PFM coordination, PFM extensibility, posture, and pain in order to increase function and improve overall QOL. ? ? ?OBJECTIVE IMPAIRMENTS decreased activity tolerance, decreased coordination, increased muscle spasms, improper body mechanics, postural dysfunction, and pain.  ? ?ACTIVITY LIMITATIONS community activity, meal prep, occupation, and yard work.  ? ?PERSONAL FACTORS Age, Behavior pattern, Past/current experiences, and Time since onset of injury/illness/exacerbation are also affecting patient's functional outcome.  ? ? ?REHAB POTENTIAL: Fair  ? ?CLINICAL DECISION MAKING: Evolving/moderate complexity ? ?EVALUATION COMPLEXITY: Moderate ? ? ?GOALS: ?Goals reviewed with patient? Yes ? ?LONG TERM GOALS: Target date: 04/01/2022 ? ?Patient will demonstrate independence with HEP in order to maximize therapeutic gains and improve carryover from physical therapy sessions to ADLs in the home and community. ?Baseline: provided with initial stretches ?Goal status: INITIAL ? ?2.  Patient will decrease worst pain as reported on NPRS by at least 2 points to demonstrate clinically significant reduction in pain in order to restore/improve function and overall QOL. ?Baseline: 7/10 ?Goal status: INITIAL ? ?3.  Patient will demonstrate improved function as evidenced by a score of <25 on FOTO measure for full participation in activities at home and in the community.  ?Baseline: 58 ?Goal status: INITIAL ? ?4.  Patient will demonstrate coordinated lengthening and relaxation of PFM with diaphragmatic inhalation in order to decrease spasm and allow for unrestricted elimination of urine/feces for improved overall QOL. ? ?Baseline: not demonstrated ?Goal status: INITIAL ? ? ? ?PLAN: ?PT FREQUENCY: 1x/week ? ?PT DURATION: 12 weeks ? ?PLANNED INTERVENTIONS: Therapeutic exercises, Therapeutic activity, Neuromuscular re-education, Balance training, Gait  training, Patient/Family education, Joint mobilization, Orthotic/Fit training, Electrical stimulation, Spinal manipulation, Spinal mobilization, Cryotherapy, Moist heat, Taping, Biofeedback, and Manual therapy ? ?PLAN FOR NEXT SESSION: PFM downtraining ? ? ?Sheria Lang PT, DPT [phone]  ?02/18/2022, 9:04 AM ? ?  ? ?

## 2022-03-04 ENCOUNTER — Encounter: Payer: Medicaid Other | Admitting: Physical Therapy

## 2022-03-05 ENCOUNTER — Ambulatory Visit: Payer: Medicaid Other | Attending: Urology | Admitting: Physical Therapy

## 2022-03-05 ENCOUNTER — Encounter: Payer: Self-pay | Admitting: Physical Therapy

## 2022-03-05 DIAGNOSIS — M62838 Other muscle spasm: Secondary | ICD-10-CM | POA: Insufficient documentation

## 2022-03-05 DIAGNOSIS — R102 Pelvic and perineal pain: Secondary | ICD-10-CM | POA: Diagnosis present

## 2022-03-05 DIAGNOSIS — R278 Other lack of coordination: Secondary | ICD-10-CM | POA: Insufficient documentation

## 2022-03-05 NOTE — Therapy (Signed)
?OUTPATIENT PHYSICAL THERAPY TREATMENT NOTE ? ? ?Patient Name: Ricardo Gibson ?MRN: [id number] ?DOB:2001/06/10, 21 y.o., male ?Today's Date: 03/05/2022 ? ?PCP: Mick Sell, MD ?REFERRING PROVIDER: Sondra Come, MD ? ?END OF SESSION:  ? PT End of Session - 03/05/22 1124   ? ? Visit Number 4   ? Number of Visits 8   ? Date for PT Re-Evaluation 04/01/22   ? PT Start Time 1120   ? PT Stop Time 1200   ? PT Time Calculation (min) 40 min   ? Activity Tolerance Patient tolerated treatment well   ? Behavior During Therapy [facility] for tasks assessed/performed   ? ?  ?  ? ?  ? ? ?History reviewed. No pertinent past medical history. ?History reviewed. No pertinent surgical history. ?There are no problems to display for this patient. ? ? ?REFERRING DIAG: M62.89 (ICD-10-CM) - Pelvic floor dysfunction ? ?THERAPY DIAG:  ?Other muscle spasm ? ?Other lack of coordination ? ?Pelvic pain ? ?PERTINENT HISTORY: Scrotal ultrasound benign; urinalysis benign; "On exam, phallus without lesions, testicles 20 cc and descended bilaterally without masses, nontender, tenderness at the lower abdomen and base of the penis on exam, no skin lesions" MD note 01/21/2022 ? ?PRECAUTIONS: None ? ?SUBJECTIVE: Patient states that everything is mostly improved. He still will have onset of pain in standing, but clarifies that this will occur after a few/several minutes of standing. Patient has had some success managing pain in standing with standing stretches. Patient reports that the only other concern he has is regarding bladder symptoms. He notes he continues to have increased intensity of urge with clothing that is tight around the abdomen and with sudden movement patient will feel increased urinary urge. Patient has increased water intake recently. He notes that when he empties in the presence of the increased urge, he has sometimes found that his bladder is not truly full. Patient does report significantly increased stress recently and is curious if  there could be a correlation to his slowed healing. ? ?PAIN:  ?Are you having pain? Yes: NPRS scale: 3/10 ?Pain location: lower abdominal and pelvic region ?Pain description: bruised/squeezed ? ?TREATMENT ? ?02/11/2022: ?ABDOMINAL:  ?Palpation: TTP throughout abdominal region; increased non-neurologic tone throughout abdominal wall including internal and external obliques, rectus abdominus ?Suprapubic percussion: painful ?Diastasis: none noted on exam, < 2 finger breadth throughout ?Rib flare: present B ? ?EXTERNAL PELVIC EXAM: Patient educated on the purpose of the pelvic exam and articulated understanding; patient consented to the exam verbally. Exam was chaperoned by Consuela Mimes, DPT, PT ?Breath coordination: absent ?Perineal Mobility Testing: ?Voluntary Contraction: absent ?Relaxation: non-relaxing ?Perineal movement with sustained increase in IAP (?bear down?): minimal perineal descent; excessive use of rectus abdominus ?Perineal movement with rapid increase in IAP (?cough?): no perineal movement ? ?Manual Therapy: ? ? ?Neuromuscular Re-education: ?Patient educated on nervous system upregulation/stress and the impact on muscle tension throughout the body. ?Patient educated extensively on typical bladder function, typical bladder habits, basics of urge suppression, bladder irritants in order to better regulate bladder through behavioral changes.  ? ? Therapeutic Exercise: ? ? ?Treatments unbilled: ? ?Post-treatment assessment: ? ?Patient educated throughout session on appropriate technique and form using multi-modal cueing, HEP, and activity modification. Patient articulated understanding and returned demonstration. ? ?Patient Response to interventions: ?Patient comfortable to return in 2 weeks with goal of setting up mental health provider in the interim. ? ?ASSESSMENT ?Patient presents to clinic with excellent motivation to participate in therapy. Patient demonstrates deficits in PFM  coordination, PFM  extensibility, posture, and pain. Patient indicating readiness to address emotional stressors that may be impacting muscle tension and was provided with a list of local resources for follow-up. Patient participated in education on urge suppression strategies to better manage urinary urgency. Patient will benefit from continued skilled therapeutic intervention to address remaining deficits in PFM coordination, PFM extensibility, posture, and pain in order to increase function and improve overall QOL. ? ? ?OBJECTIVE IMPAIRMENTS decreased activity tolerance, decreased coordination, increased muscle spasms, improper body mechanics, postural dysfunction, and pain.  ? ?ACTIVITY LIMITATIONS community activity, meal prep, occupation, and yard work.  ? ?PERSONAL FACTORS Age, Behavior pattern, Past/current experiences, and Time since onset of injury/illness/exacerbation are also affecting patient's functional outcome.  ? ? ?REHAB POTENTIAL: Fair  ? ?CLINICAL DECISION MAKING: Evolving/moderate complexity ? ?EVALUATION COMPLEXITY: Moderate ? ? ?GOALS: ?Goals reviewed with patient? Yes ? ?LONG TERM GOALS: Target date: 04/01/2022 ? ?Patient will demonstrate independence with HEP in order to maximize therapeutic gains and improve carryover from physical therapy sessions to ADLs in the home and community. ?Baseline: provided with initial stretches ?Goal status: INITIAL ? ?2.  Patient will decrease worst pain as reported on NPRS by at least 2 points to demonstrate clinically significant reduction in pain in order to restore/improve function and overall QOL. ?Baseline: 7/10 ?Goal status: INITIAL ? ?3.  Patient will demonstrate improved function as evidenced by a score of <25 on FOTO measure for full participation in activities at home and in the community.  ?Baseline: 58 ?Goal status: INITIAL ? ?4.  Patient will demonstrate coordinated lengthening and relaxation of PFM with diaphragmatic inhalation in order to decrease spasm and allow  for unrestricted elimination of urine/feces for improved overall QOL. ? ?Baseline: not demonstrated ?Goal status: INITIAL ? ? ? ?PLAN: ?PT FREQUENCY: 1x/week ? ?PT DURATION: 12 weeks ? ?PLANNED INTERVENTIONS: Therapeutic exercises, Therapeutic activity, Neuromuscular re-education, Balance training, Gait training, Patient/Family education, Joint mobilization, Orthotic/Fit training, Electrical stimulation, Spinal manipulation, Spinal mobilization, Cryotherapy, Moist heat, Taping, Biofeedback, and Manual therapy ? ?PLAN FOR NEXT SESSION: PFM downtraining ? ? ?Sheria Lang PT, DPT [phone]  ?03/05/2022, 11:25 AM ? ?  ? ?

## 2022-03-19 ENCOUNTER — Ambulatory Visit: Payer: Medicaid Other | Admitting: Physical Therapy

## 2022-03-26 ENCOUNTER — Ambulatory Visit: Payer: Medicaid Other | Admitting: Physical Therapy

## 2022-04-01 NOTE — Telephone Encounter (Signed)
Called patient apt made

## 2022-04-02 ENCOUNTER — Encounter: Payer: Self-pay | Admitting: Urology

## 2022-04-02 ENCOUNTER — Encounter: Payer: Self-pay | Admitting: *Deleted

## 2022-04-02 ENCOUNTER — Ambulatory Visit: Payer: Medicaid Other | Admitting: Urology

## 2022-04-02 ENCOUNTER — Encounter: Payer: Self-pay | Admitting: Physical Therapy

## 2022-04-02 VITALS — BP 135/84 | HR 72 | Ht 71.0 in | Wt 127.0 lb

## 2022-04-02 DIAGNOSIS — M6289 Other specified disorders of muscle: Secondary | ICD-10-CM

## 2022-04-02 DIAGNOSIS — R102 Pelvic and perineal pain: Secondary | ICD-10-CM | POA: Diagnosis not present

## 2022-04-02 DIAGNOSIS — N4889 Other specified disorders of penis: Secondary | ICD-10-CM | POA: Diagnosis not present

## 2022-04-02 NOTE — Progress Notes (Signed)
   04/02/2022 12:25 PM   Ricardo Gibson 02/21/2001 [id number]  Reason for visit: Penile pain  HPI: 21 year old male who I originally saw in February 2023 for pelvic and scrotal pain felt to be secondary to pelvic floor dysfunction.  Scrotal ultrasound was normal.  He was ultimately seen multiple times by pelvic floor physical therapy with significant improvement in his groin and scrotal pain.  [place] and was in the car for 4 hours and feels like his symptoms have flared up again, and he was added to my schedule today.  He has burning at the top of the penis at the glans, as well as at the meatus all the time, not necessarily related to urination.  He has been putting on some different creams on the penis for what he felt like were red bumps.  He also is having pelvic pain again.  He reports he is having anxiety related to his pelvic and penile pain and having trouble sleeping.  On exam circumcised phallus with no lesions, patent meatus, no suspicious bumps or abnormalities.No tenderness.  Testicles 20 cc and descended bilaterally without masses  I had a very frank conversation with the patient and his father that I think a lot of his pelvic floor dysfunction is related to his ongoing stress and anxiety.  I recommended working on the pelvic floor exercises provided by his physical therapist, and additional stretching exercises were provided today.  I also recommended seeing his primary care doctor to consider an antianxiety medication or getting plugged in with a therapist, as I feel very strongly that his stress and anxiety are a big contributor to his pelvic floor pain and dysfunction  43-month follow-up with PA symptom check  Sondra Come, MD  [facility] [address] [place], [place] [address] [phone]

## 2022-04-02 NOTE — Patient Instructions (Signed)

## 2022-04-28 ENCOUNTER — Ambulatory Visit: Payer: Medicaid Other | Admitting: Urology

## 2022-07-25 NOTE — Progress Notes (Unsigned)
07/28/2022 9:49 AM   Ricardo Gibson 2001/09/25 [id number]  Referring provider: Leonel Ramsay, MD [place],  [place] [address]  Urological history: 1.  Scrotal pain -Scrotal ultrasound (03/2022) -normal -Pelvic floor physical therapy was helpful in the past  No chief complaint on file.   HPI: Ricardo Gibson is a 21 y.o. male who presents today for recheck on scrotal pain.    PMH: No past medical history on file.  Surgical History: No past surgical history on file.  Home Medications:  Allergies as of 07/28/2022       Reactions   Peanut (diagnostic) Anaphylaxis   Peanut-containing Drug Products Shortness Of Breath   Penicillins Other (See Comments)   Pt does not know his reaction to Penicillin. Pt was told by his parents that he was allergic and has never taken the medication. Other reaction(s): Unknown Not sure just told by parent never to take it        Medication List        Accurate as of July 25, 2022  9:49 AM. If you have any questions, ask your nurse or doctor.          albuterol 108 (90 Base) MCG/ACT inhaler Commonly known as: VENTOLIN HFA Inhale into the lungs.        Allergies:  Allergies  Allergen Reactions   Peanut (Diagnostic) Anaphylaxis   Peanut-Containing Drug Products Shortness Of Breath   Penicillins Other (See Comments)    Pt does not know his reaction to Penicillin. Pt was told by his parents that he was allergic and has never taken the medication.  Other reaction(s): Unknown Not sure just told by parent never to take it    Family History: No family history on file.  Social History:  reports that he has never smoked. He has never been exposed to tobacco smoke. He has never used smokeless tobacco. He reports current alcohol use. He reports current drug use.  ROS: Pertinent ROS in HPI  Physical Exam: There were no vitals taken for this visit.  Constitutional:  Well nourished. Alert and  oriented, No acute distress. HEENT: [place] AT, moist mucus membranes.  Trachea midline, no masses. Cardiovascular: No clubbing, cyanosis, or edema. Respiratory: Normal respiratory effort, no increased work of breathing. GI: Abdomen is soft, non tender, non distended, no abdominal masses. Liver and spleen not palpable.  No hernias appreciated.  Stool sample for occult testing is not indicated.   GU: No CVA tenderness.  No bladder fullness or masses.  Patient with circumcised/uncircumcised phallus. ***Foreskin easily retracted***  Urethral meatus is patent.  No penile discharge. No penile lesions or rashes. Scrotum without lesions, cysts, rashes and/or edema.  Testicles are located scrotally bilaterally. No masses are appreciated in the testicles. Left and right epididymis are normal. Rectal: Patient with  normal sphincter tone. Anus and perineum without scarring or rashes. No rectal masses are appreciated. Prostate is approximately *** grams, *** nodules are appreciated. Seminal vesicles are normal. Skin: No rashes, bruises or suspicious lesions. Lymph: No cervical or inguinal adenopathy. Neurologic: Grossly intact, no focal deficits, moving all 4 extremities. Psychiatric: Normal mood and affect.  Laboratory Data: Thyroid Stimulating Hormone (TSH) 0.450-5.330 uIU/ml uIU/mL 0.779   Resulting Agency  [facility] - LAB   Specimen Collected: 02/20/22 10:43 Last Resulted: 02/20/22 12:31  Received From: [place]  Result Received: 02/27/22 11:39   WBC (White Blood Cell Count) 4.1 - 10.2 10^3/uL 4.8  RBC (Red Blood Cell Count) 4.69 - 6.13 10^6/uL 5.22   Hemoglobin 14.1 - 18.1 gm/dL 16.3   Hematocrit 40.0 - 52.0 % 45.5   MCV (Mean Corpuscular Volume) 80.0 - 100.0 fl 87.2   MCH (Mean Corpuscular Hemoglobin) 27.0 - 31.2 pg 31.2   MCHC (Mean Corpuscular Hemoglobin Concentration) 32.0 - 36.0 gm/dL 35.8   Platelet Count 150 - 450 10^3/uL 309   RDW-CV (Red Cell Distribution  Width) 11.6 - 14.8 % 11.6   MPV (Mean Platelet Volume) 9.4 - 12.4 fl 10.1   Neutrophils 1.50 - 7.80 10^3/uL 3.02   Lymphocytes 1.00 - 3.60 10^3/uL 1.36   Monocytes 0.00 - 1.50 10^3/uL 0.33   Eosinophils 0.00 - 0.55 10^3/uL 0.08   Basophils 0.00 - 0.09 10^3/uL 0.05   Neutrophil % 32.0 - 70.0 % 62.4   Lymphocyte % 10.0 - 50.0 % 28.1   Monocyte % 4.0 - 13.0 % 6.8   Eosinophil % 1.0 - 5.0 % 1.7   Basophil% 0.0 - 2.0 % 1.0   Immature Granulocyte % <=0.7 % 0.0   Immature Granulocyte Count <=0.06 10^3/L 0.00   Resulting Agency  [place] - LAB   Specimen Collected: 02/20/22 10:43 Last Resulted: 02/20/22 10:49  Received From: [place]  Result Received: 02/27/22 11:39   Glucose 70 - 110 mg/dL 124 High    Sodium 136 - 145 mmol/L 140   Potassium 3.6 - 5.1 mmol/L 4.1   Chloride 97 - 109 mmol/L 103   Carbon Dioxide (CO2) 22.0 - 32.0 mmol/L 27.7   Urea Nitrogen (BUN) 7 - 25 mg/dL 11   Creatinine 0.7 - 1.3 mg/dL 1.0   Glomerular Filtration Rate (eGFR), MDRD Estimate >60 mL/min/1.73sq m 94   Calcium 8.7 - 10.3 mg/dL 9.9   AST  8 - 39 U/L 14   ALT  6 - 57 U/L 10   Alk Phos (alkaline Phosphatase) 34 - 104 U/L 91   Albumin 3.5 - 4.8 g/dL 4.8   Bilirubin, Total 0.3 - 1.2 mg/dL 0.8   Protein, Total 6.1 - 7.9 g/dL 7.9   A/G Ratio 1.0 - 5.0 gm/dL 1.5   Resulting Agency  [place] - LAB   Specimen Collected: 02/20/22 10:43 Last Resulted: 02/20/22 11:10  Received From: [place]  Result Received: 02/27/22 11:39   Sedimentation Rate-Automated 0 - 20 mm/hr 12   Resulting Agency  [place] - LAB   Specimen Collected: 02/20/22 10:43 Last Resulted: 02/20/22 12:47  Received From: [place]  Result Received: 02/27/22 11:39   Component     Latest Ref Rng 12/24/2021  Color, Urine     YELLOW  YELLOW   Appearance     CLEAR  CLEAR   Specific Gravity, Urine     1.005 - 1.030  1.010   pH     5.0 - 8.0  6.0    Glucose, UA     NEGATIVE mg/dL NEGATIVE   Hgb urine dipstick     NEGATIVE  NEGATIVE   Bilirubin Urine     NEGATIVE  NEGATIVE   Ketones, ur     NEGATIVE mg/dL NEGATIVE   Protein     NEGATIVE mg/dL NEGATIVE   Nitrite     NEGATIVE  NEGATIVE   Leukocytes,Ua     NEGATIVE  NEGATIVE   Squamous Epithelial / LPF     0 - 5  0-5   WBC, UA  0 - 5 WBC/hpf 0-5   RBC / HPF     0 - 5 RBC/hpf 0-5   Bacteria, UA     NONE SEEN  FEW !     Legend: ! Abnormal HIV 1/2 Antibodies Negative Negative   HIV 1 P24 Antigen Negative Negative   Resulting Agency  [place] - LAB  Narrative Performed by [facility] - LAB This test has not been validated on patients under 3 years of age.  This test has not been validated on patients under 22 years of age.  Specimen Collected: 12/17/21 10:51 Last Resulted: 12/17/21 13:05  Received From: [place]  Result Received: 01/21/22 09:34   Chlamydia trachomatis, PCR - [facility] Not Detected, INVALID Not Detected   Neisseria gonorrhoeae, PCR - [facility] Not Detected, INVALID Not Detected   Resulting Agency  [facility] - LAB   Specimen Collected: 12/17/21 10:51 Last Resulted: 12/17/21 12:38  Received From: [place]  Result Received: 01/21/22 09:34    Urinalysis    Component Value Date/Time   COLORURINE YELLOW 12/24/2021 1338   APPEARANCEUR CLEAR 12/24/2021 1338   LABSPEC 1.010 12/24/2021 [place] 6.0 12/24/2021 [place] 12/24/2021 1338   HGBUR NEGATIVE 12/24/2021 [place] 12/24/2021 1338   [place] 12/24/2021 1338   PROTEINUR NEGATIVE 12/24/2021 1338   NITRITE NEGATIVE 12/24/2021 1338   LEUKOCYTESUR NEGATIVE 12/24/2021 1338  I have reviewed the labs.   Pertinent Imaging: CLINICAL DATA:  Bilateral testicular pain   EXAM: SCROTAL ULTRASOUND   DOPPLER ULTRASOUND OF THE TESTICLES   TECHNIQUE: Complete ultrasound examination  of the testicles, epididymis, and other scrotal structures was performed. Color and spectral Doppler ultrasound were also utilized to evaluate blood flow to the testicles.   COMPARISON:  None.   FINDINGS: Right testicle   Measurements: 5.0 x 2.0 x 2.8 cm. No mass or microlithiasis visualized.   Left testicle   Measurements: 4.7 x 1.8 x 2.9 cm. No mass or microlithiasis visualized.   Right epididymis:  Normal in size and appearance.   Left epididymis:  6 mm epididymal cyst is seen.   Hydrocele:  None visualized.   Varicocele:  None visualized.   Pulsed Doppler interrogation of both testes demonstrates normal low resistance arterial and venous waveforms bilaterally.   IMPRESSION: No significant sonographic abnormality of the testes.     Electronically Signed   By: Miachel Roux M.D.   On: 12/18/2021 10:15 I have independently reviewed the films.    Assessment & Plan:  ***  1. Scrotal pain -Mycoplasma Ureaplasma culture sent  No follow-ups on file.  These notes generated with voice recognition software. I apologize for typographical errors.  [place], [place] [address]  [place], [place] [address] [phone]

## 2022-07-28 ENCOUNTER — Ambulatory Visit
Admission: RE | Admit: 2022-07-28 | Discharge: 2022-07-28 | Disposition: A | Discharge status: Home / Self Care | Payer: Medicaid Other | Source: Ambulatory Visit | Attending: Urology | Admitting: Urology

## 2022-07-28 ENCOUNTER — Ambulatory Visit: Payer: Medicaid Other | Admitting: Urology

## 2022-07-28 ENCOUNTER — Encounter: Payer: Self-pay | Admitting: Urology

## 2022-07-28 ENCOUNTER — Other Ambulatory Visit
Admission: RE | Admit: 2022-07-28 | Discharge: 2022-07-28 | Disposition: A | Discharge status: Home / Self Care | Payer: Medicaid Other | Source: Home / Self Care | Attending: Urology | Admitting: Urology

## 2022-07-28 ENCOUNTER — Ambulatory Visit
Admission: RE | Admit: 2022-07-28 | Discharge: 2022-07-28 | Disposition: A | Discharge status: Home / Self Care | Payer: Medicaid Other | Attending: Urology | Admitting: Urology

## 2022-07-28 VITALS — BP 168/79 | HR 78 | Ht 71.0 in | Wt 126.0 lb

## 2022-07-28 DIAGNOSIS — R109 Unspecified abdominal pain: Secondary | ICD-10-CM | POA: Insufficient documentation

## 2022-07-28 DIAGNOSIS — M545 Low back pain, unspecified: Secondary | ICD-10-CM | POA: Diagnosis not present

## 2022-07-28 DIAGNOSIS — N5082 Scrotal pain: Secondary | ICD-10-CM

## 2022-07-28 DIAGNOSIS — M549 Dorsalgia, unspecified: Secondary | ICD-10-CM

## 2022-08-04 ENCOUNTER — Other Ambulatory Visit: Payer: Self-pay | Admitting: Urology

## 2022-08-04 DIAGNOSIS — G8929 Other chronic pain: Secondary | ICD-10-CM

## 2022-08-04 DIAGNOSIS — M25551 Pain in right hip: Secondary | ICD-10-CM

## 2022-08-04 LAB — MISC LABCORP TEST (SEND OUT): Labcorp test code: 86884

## 2023-01-31 IMAGING — US US SCROTUM W/ DOPPLER COMPLETE
1 series · 14 of 25 positions shown · non-contrast
Comparison: None.

CLINICAL DATA: Bilateral testicular pain

EXAM:
SCROTAL ULTRASOUND
DOPPLER ULTRASOUND OF THE TESTICLES
TECHNIQUE: Complete ultrasound examination of the testicles, epididymis, and
other scrotal structures was performed. Color and spectral Doppler
ultrasound were also utilized to evaluate blood flow to the
testicles.

[Series 1: us scrotum w/doppler · 14 of 36 slices shown]
[im 1/36]
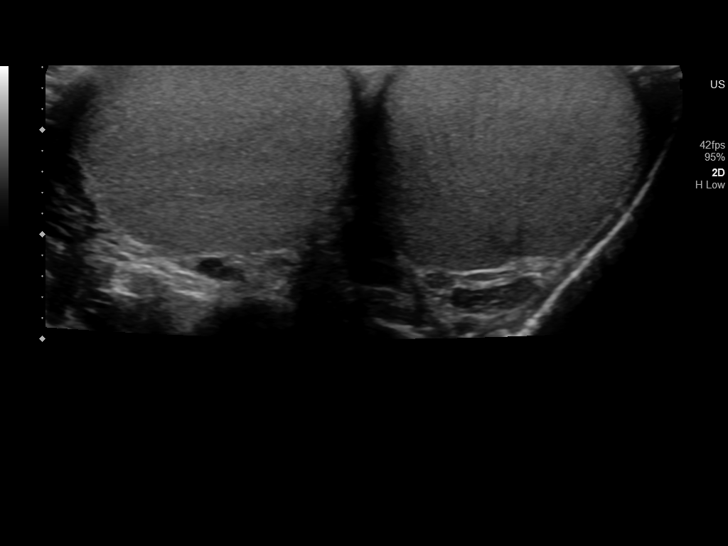
[im 3/36]
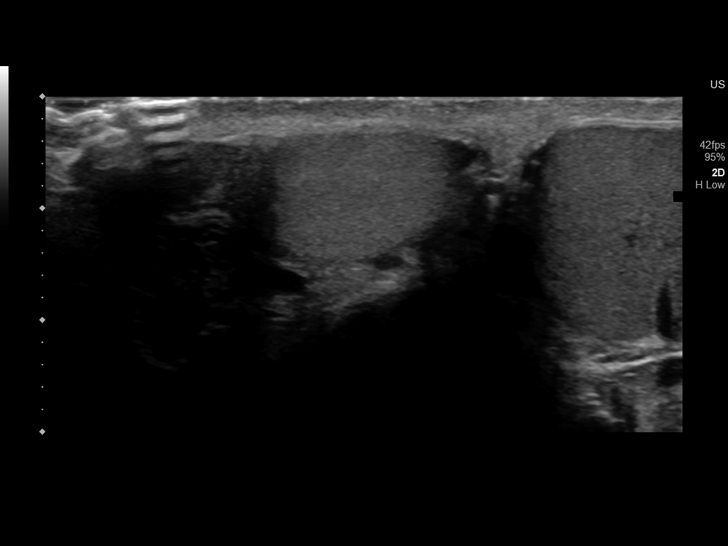
[im 6/36]
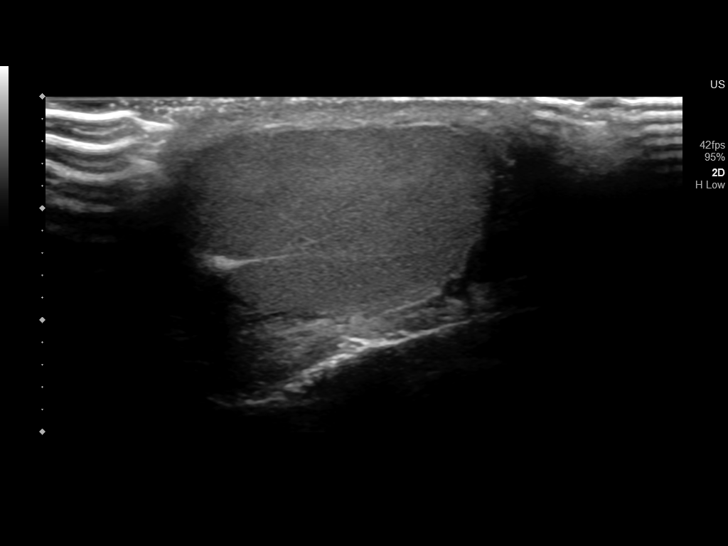
[im 9/36]
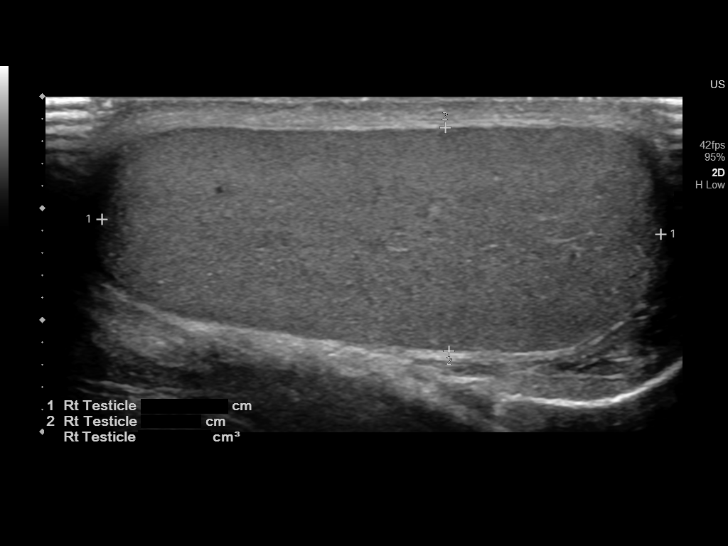
[im 12/36]
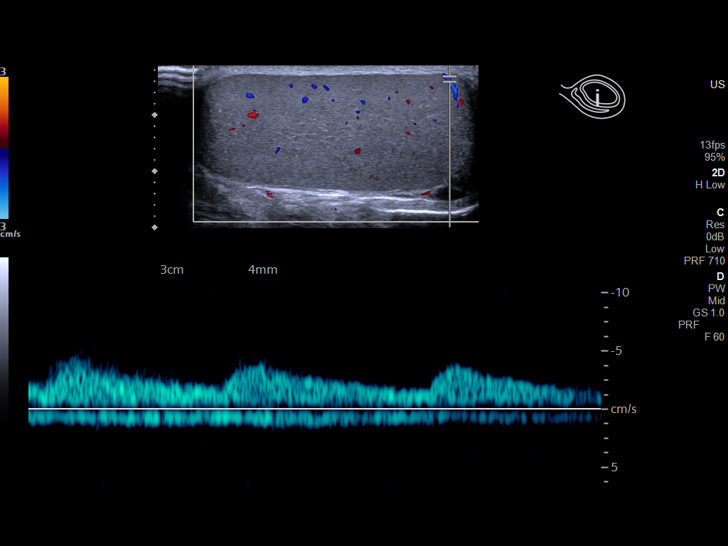
[im 14/36]
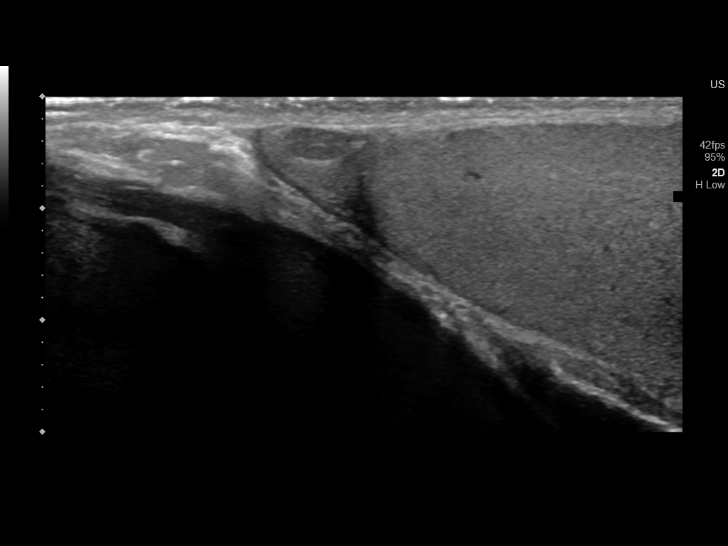
[im 17/36]
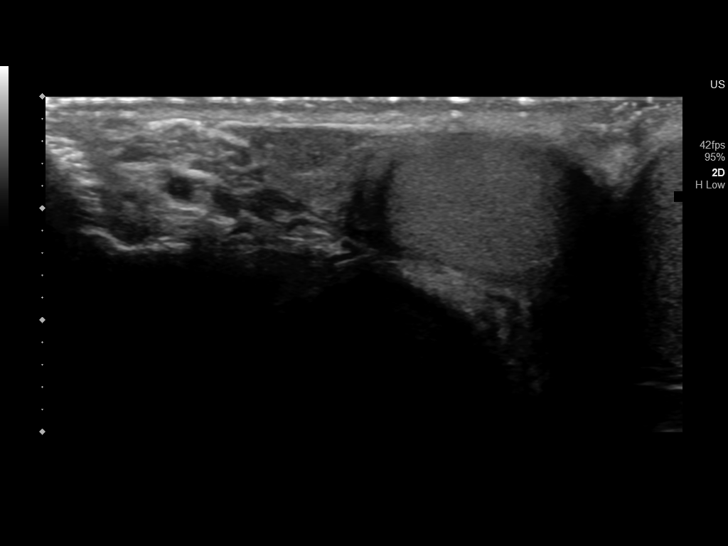
[im 19/36]
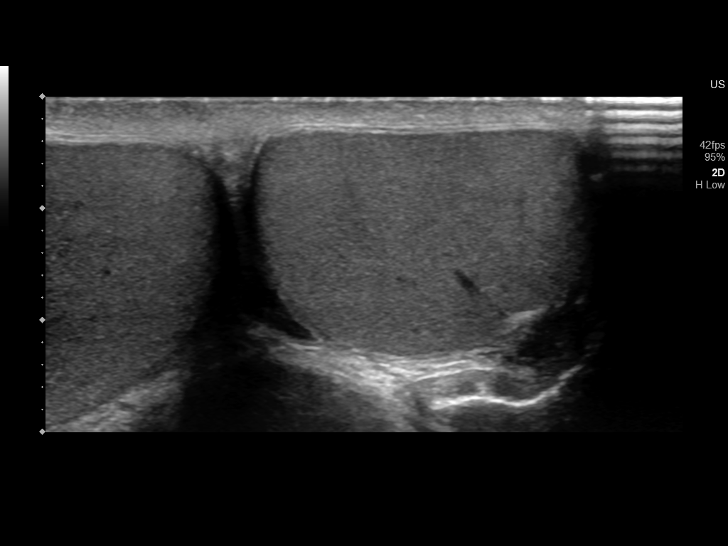
[im 22/36]
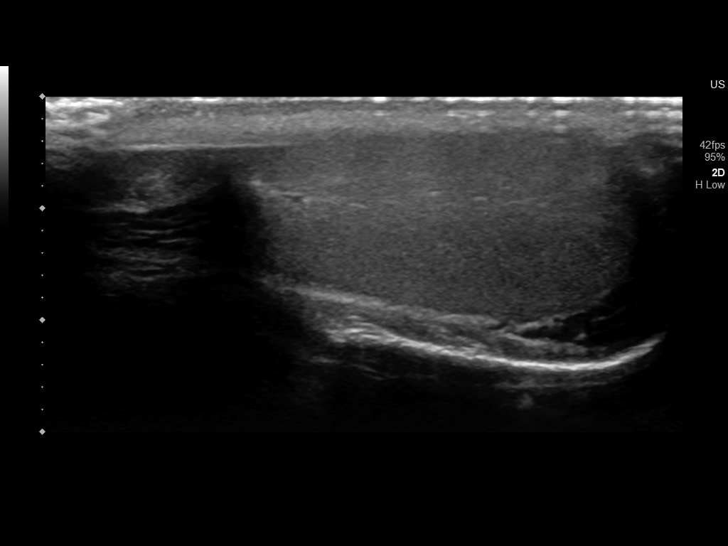
[im 24/36]
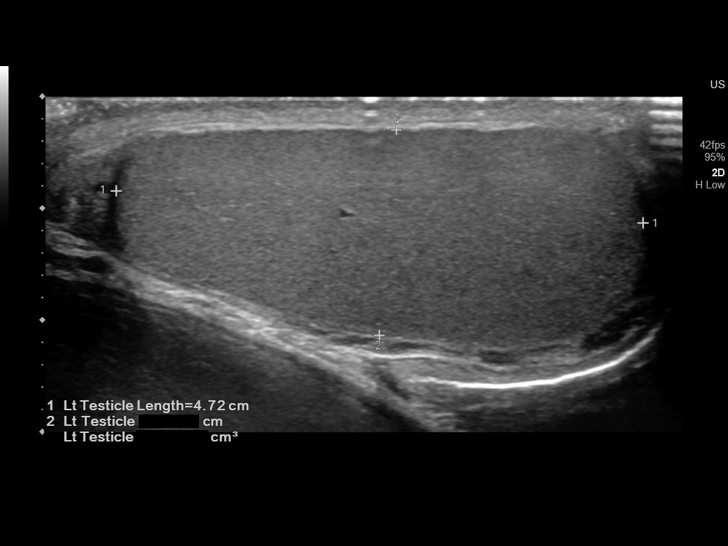
[im 27/36]
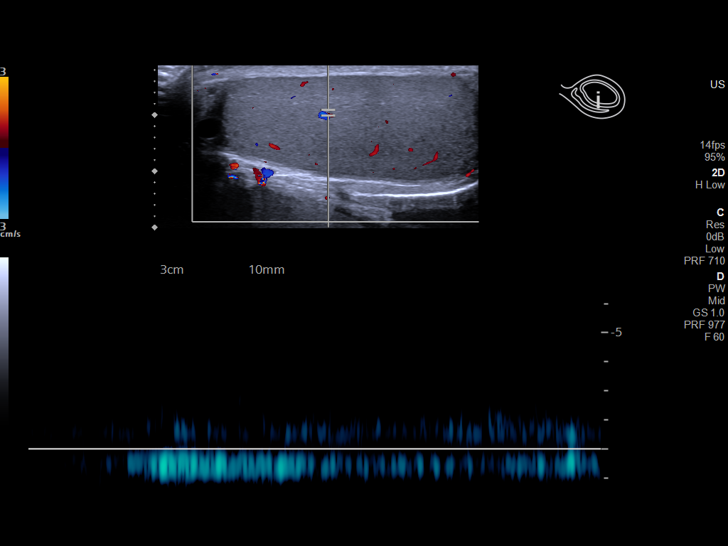
[im 30/36]
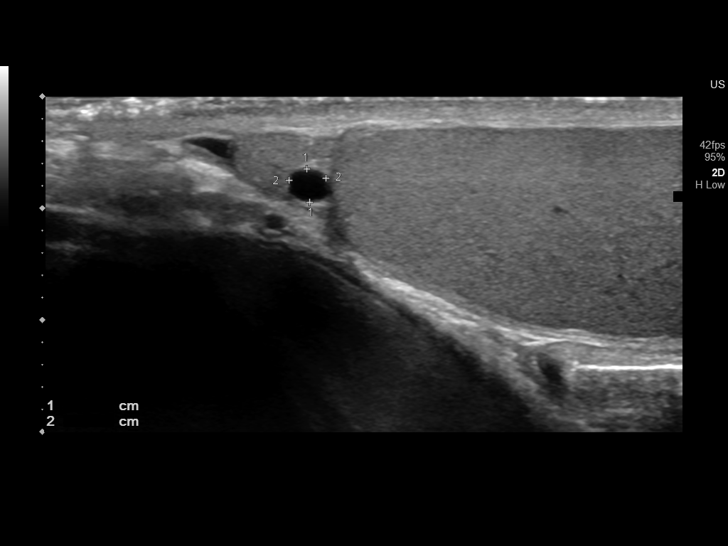
[im 33/36]
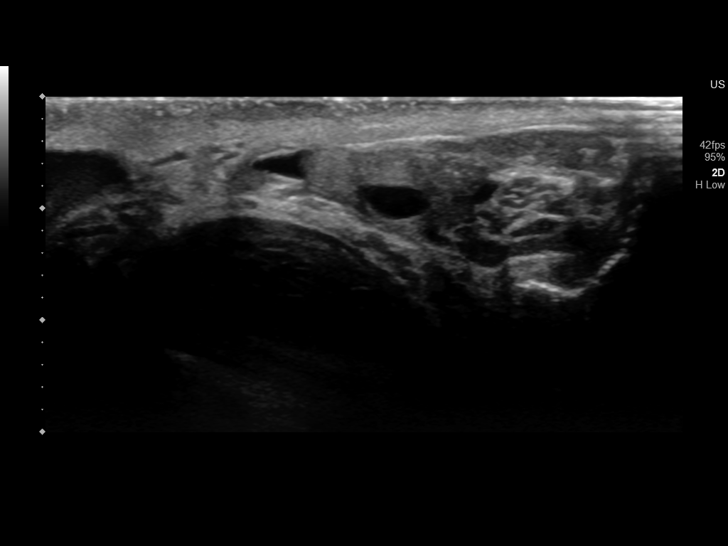
[im 36/36]
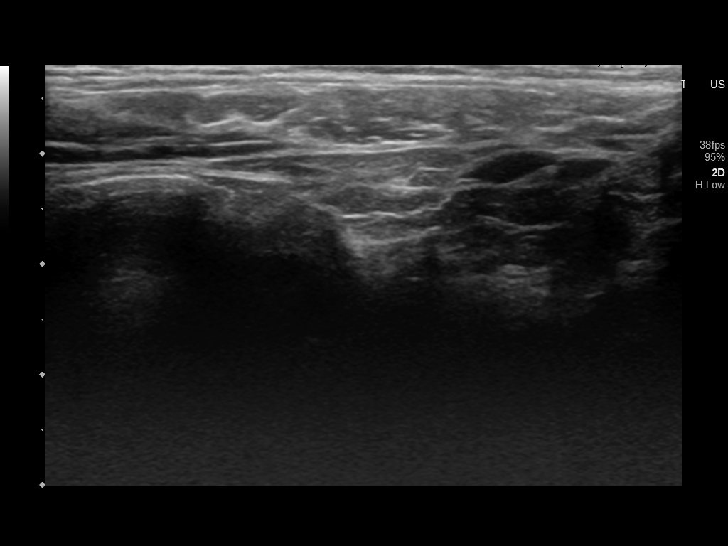

[14 of 25 positions shown; findings below may reference images not displayed]

FINDINGS: Right testicle

Measurements: 5.0 x 2.0 x 2.8 cm. No mass or microlithiasis
visualized.

Left testicle

Measurements: 4.7 x 1.8 x 2.9 cm. No mass or microlithiasis
visualized.

Right epididymis:  Normal in size and appearance.

Left epididymis:  6 mm epididymal cyst is seen.

Hydrocele:  None visualized.

Varicocele:  None visualized.

Pulsed Doppler interrogation of both testes demonstrates normal low
resistance arterial and venous waveforms bilaterally.
IMPRESSION: No significant sonographic abnormality of the testes.

## 2023-05-05 ENCOUNTER — Ambulatory Visit: Payer: Medicaid Other | Admitting: Psychiatry

## 2023-11-06 ENCOUNTER — Emergency Department
Admission: EM | Admit: 2023-11-06 | Discharge: 2023-11-06 | Disposition: A | Discharge status: Home / Self Care | Payer: Medicaid Other | Attending: Emergency Medicine | Admitting: Emergency Medicine

## 2023-11-06 ENCOUNTER — Emergency Department: Payer: Medicaid Other

## 2023-11-06 ENCOUNTER — Other Ambulatory Visit: Payer: Self-pay

## 2023-11-06 ENCOUNTER — Encounter: Payer: Self-pay | Admitting: Intensive Care

## 2023-11-06 DIAGNOSIS — Z20822 Contact with and (suspected) exposure to covid-19: Secondary | ICD-10-CM | POA: Diagnosis not present

## 2023-11-06 DIAGNOSIS — R0981 Nasal congestion: Secondary | ICD-10-CM | POA: Insufficient documentation

## 2023-11-06 DIAGNOSIS — B974 Respiratory syncytial virus as the cause of diseases classified elsewhere: Secondary | ICD-10-CM | POA: Diagnosis not present

## 2023-11-06 DIAGNOSIS — B338 Other specified viral diseases: Secondary | ICD-10-CM

## 2023-11-06 HISTORY — DX: Depression, unspecified: F32.A

## 2023-11-06 HISTORY — DX: Unspecified asthma, uncomplicated: J45.909

## 2023-11-06 LAB — RESP PANEL BY RT-PCR (RSV, FLU A&B, COVID)  RVPGX2
Influenza A by PCR: NEGATIVE
Influenza B by PCR: NEGATIVE
Resp Syncytial Virus by PCR: POSITIVE — AB
SARS Coronavirus 2 by RT PCR: NEGATIVE

## 2023-11-06 LAB — GROUP A STREP BY PCR: Group A Strep by PCR: NOT DETECTED

## 2023-11-06 MED ORDER — IPRATROPIUM-ALBUTEROL 0.5-2.5 (3) MG/3ML IN SOLN
3.0000 mL | Freq: Once | RESPIRATORY_TRACT | Status: AC
  Administered 2023-11-06: 3 mL via RESPIRATORY_TRACT
  Filled 2023-11-06: qty 3

## 2023-11-06 NOTE — Discharge Instructions (Signed)
 Years your swab was positive for RSV.  Your chest x-ray does not reveal any evidence of pneumonia.  You may continue to use your albuterol  inhaler as needed and you may take Tylenol/ibuprofen  per package instructions to help with your symptoms.  Please return for any new, worsening, or change in symptoms or other concerns.  It was a pleasure caring for you today.

## 2023-11-06 NOTE — ED Provider Notes (Signed)
 [facility] Provider Note    Event Date/Time   First MD Initiated Contact with Patient 11/06/23 1503     (approximate)   History   Cough   HPI  Ricardo Gibson is a 23 y.o. male with no reported past medical history who presents today for evaluation of nasal congestion, cough, and wheezing.  Patient reports that his sister sick with the same symptoms.  He thinks this has been ongoing for the past 2 to 3 days.  No fevers.  There are no active problems to display for this patient.         Physical Exam   Triage Vital Signs: ED Triage Vitals  Encounter Vitals Group     BP 11/06/23 1215 114/88     Systolic BP Percentile --      Diastolic BP Percentile --      Pulse Rate 11/06/23 1215 95     Resp 11/06/23 1215 18     Temp 11/06/23 1215 98.5 F (36.9 C)     Temp Source 11/06/23 1215 Oral     SpO2 11/06/23 1215 94 %     Weight 11/06/23 1217 125 lb (56.7 kg)     Height 11/06/23 1217 5' 11 (1.803 m)     Head Circumference --      Peak Flow --      Pain Score 11/06/23 1217 7     Pain Loc --      Pain Education --      Exclude from Growth Chart --     Most recent vital signs: Vitals:   11/06/23 1215 11/06/23 1628  BP: 114/88 117/79  Pulse: 95 61  Resp: 18 18  Temp: 98.5 F (36.9 C) 98.4 F (36.9 C)  SpO2: 94% 95%    Physical Exam Vitals and nursing note reviewed.  Constitutional:      General: Awake and alert. No acute distress.    Appearance: Normal appearance. The patient is normal weight.  HENT:     Head: Normocephalic and atraumatic.     Mouth: Mucous membranes are moist.  Nasal congestion or rhinorrhea present Eyes:     General: PERRL. Normal EOMs        Right eye: No discharge.        Left eye: No discharge.     Conjunctiva/sclera: Conjunctivae normal.  Cardiovascular:     Rate and Rhythm: Normal rate and regular rhythm.     Pulses: Normal pulses.  Pulmonary:     Effort: Pulmonary effort is normal. No respiratory  distress.  Able to speak easily in complete sentences, no accessory muscle use.    Breath sounds: Faint expiratory wheezes in bilateral lower lung fields Abdominal:     Abdomen is soft. There is no abdominal tenderness. No rebound or guarding. No distention. Musculoskeletal:        General: No swelling. Normal range of motion.     Cervical back: Normal range of motion and neck supple.  Skin:    General: Skin is warm and dry.     Capillary Refill: Capillary refill takes less than 2 seconds.     Findings: No rash.  Neurological:     Mental Status: The patient is awake and alert.      ED Results / Procedures / Treatments   Labs (all labs ordered are listed, but only abnormal results are displayed) Labs Reviewed  RESP PANEL BY RT-PCR (RSV, FLU A&B, COVID)  RVPGX2 - Abnormal; Notable  for the following components:      Result Value   Resp Syncytial Virus by PCR POSITIVE (*)    All other components within normal limits  GROUP A STREP BY PCR     EKG     RADIOLOGY I independently reviewed and interpreted imaging and agree with radiologists findings.     PROCEDURES:  Critical Care performed:   Procedures   MEDICATIONS ORDERED IN ED: Medications  ipratropium-albuterol  (DUONEB) 0.5-2.5 (3) MG/3ML nebulizer solution 3 mL (3 mLs Nebulization Given 11/06/23 1630)     IMPRESSION / MDM / ASSESSMENT AND PLAN / ED COURSE  I reviewed the triage vital signs and the nursing notes.   Differential diagnosis includes, but is not limited to, RSV, COVID, pneumonia, influenza, bronchitis.  Patient is awake and alert, hemodynamically stable and afebrile.  He is nontoxic in appearance.  He is able to speak easily in complete sentences and has no accessory muscle use.  He has faint expiratory wheezes in his bilateral lower lung fields but otherwise his lungs are clear to auscultation bilaterally.  He was given a DuoNeb for these wheezes with resolution of these wheezes.  Chest x-ray  obtained in triage was negative for lobar pneumonia or cardiopulmonary abnormality.  He also had a COVID/flu/RSV swab which was positive for RSV.  We discussed this diagnosis.  Discussed that he is highly contagious to others, and those who are particularly vulnerable such as the immunocompromise, the very young, the very old are particularly at risk for severe disease, and he was instructed to keep his distance from these populations.  We discussed antibiotic management and return precautions.  Patient or stands and agrees with plan.  He was discharged in stable condition.  Patient's presentation is most consistent with acute complicated illness / injury requiring diagnostic workup.    FINAL CLINICAL IMPRESSION(S) / ED DIAGNOSES   Final diagnoses:  RSV infection     Rx / DC Orders   ED Discharge Orders     None        Note:  This document was prepared using Dragon voice recognition software and may include unintentional dictation errors.   Laria Grimmett E, PA-C 11/06/23 1818    Suzanne Kirsch, MD 11/07/23 [phone]

## 2023-11-06 NOTE — ED Triage Notes (Addendum)
 Patient went to be seen at [facility] this morning for cough with congestion, fever, and body aches. KC brought patient over to ED because he started having a coughing attack and felt lightheaded during.   Patient is A&O x4 in triage with NAD noted.   Labs drawn at [facility] today

## 2023-11-06 NOTE — ED Provider Triage Note (Signed)
 Emergency Medicine Provider Triage Evaluation Note  DELTON STELLE , a 23 y.o. male  was evaluated in triage.  Pt complains of coughing fits that cause him to feel lightheaded. Also has congestion, fever, and body aches. Labs were drawn at [facility] before they brought him over.  Physical Exam  BP 114/88 (BP Location: Left Arm)   Pulse 95   Temp 98.5 F (36.9 C) (Oral)   Resp 18   Ht 5' 11 (1.803 m)   Wt 56.7 kg   SpO2 94%   BMI 17.43 kg/m  Gen:   Awake, no distress   Resp:  Normal effort  MSK:   Moves extremities without difficulty  Other:    Medical Decision Making  Medically screening exam initiated at 12:22 PM.  Appropriate orders placed.  LORRAINE TERRIQUEZ was informed that the remainder of the evaluation will be completed by another provider, this initial triage assessment does not replace that evaluation, and the importance of remaining in the ED until their evaluation is complete.   Herlinda Kirk NOVAK, FNP 11/06/23 1533

## 2024-05-03 ENCOUNTER — Other Ambulatory Visit
Admission: RE | Admit: 2024-05-03 | Discharge: 2024-05-03 | Disposition: A | Discharge status: Home / Self Care | Source: Ambulatory Visit | Attending: Pulmonary Disease | Admitting: Pulmonary Disease

## 2024-05-03 DIAGNOSIS — R0600 Dyspnea, unspecified: Secondary | ICD-10-CM | POA: Insufficient documentation

## 2024-05-03 DIAGNOSIS — R0689 Other abnormalities of breathing: Secondary | ICD-10-CM | POA: Insufficient documentation

## 2024-05-03 LAB — D-DIMER, QUANTITATIVE: D-Dimer, Quant: <0.27 ug{FEU}/mL (ref 0.00–0.50)

## 2024-06-09 ENCOUNTER — Other Ambulatory Visit: Payer: Self-pay | Admitting: Pulmonary Disease

## 2024-06-09 DIAGNOSIS — R0689 Other abnormalities of breathing: Secondary | ICD-10-CM

## 2024-06-24 ENCOUNTER — Encounter: Payer: Self-pay | Admitting: Internal Medicine

## 2024-07-01 ENCOUNTER — Ambulatory Visit
Admission: RE | Admit: 2024-07-01 | Discharge: 2024-07-01 | Disposition: A | Discharge status: Home / Self Care | Source: Ambulatory Visit | Attending: Pulmonary Disease | Admitting: Pulmonary Disease

## 2024-07-01 DIAGNOSIS — R0689 Other abnormalities of breathing: Secondary | ICD-10-CM | POA: Insufficient documentation

## 2024-07-01 DIAGNOSIS — R0600 Dyspnea, unspecified: Secondary | ICD-10-CM | POA: Insufficient documentation

## 2024-07-01 MED ORDER — IOHEXOL 350 MG/ML SOLN
75.0000 mL | Freq: Once | INTRAVENOUS | Status: AC | PRN
  Administered 2024-07-01: 75 mL via INTRAVENOUS

## 2024-11-10 ENCOUNTER — Other Ambulatory Visit: Payer: Self-pay | Admitting: Pulmonary Disease

## 2024-11-18 ENCOUNTER — Other Ambulatory Visit: Payer: Self-pay

## 2024-11-18 ENCOUNTER — Encounter
Admission: RE | Admit: 2024-11-18 | Discharge: 2024-11-18 | Disposition: A | Discharge status: Home / Self Care | Source: Ambulatory Visit | Attending: Pulmonary Disease | Admitting: Pulmonary Disease

## 2024-11-18 DIAGNOSIS — T17500A Unspecified foreign body in bronchus causing asphyxiation, initial encounter: Secondary | ICD-10-CM

## 2024-11-18 DIAGNOSIS — Z01812 Encounter for preprocedural laboratory examination: Secondary | ICD-10-CM

## 2024-11-18 DIAGNOSIS — J984 Other disorders of lung: Secondary | ICD-10-CM

## 2024-11-18 DIAGNOSIS — Z01810 Encounter for preprocedural cardiovascular examination: Secondary | ICD-10-CM

## 2024-11-18 HISTORY — DX: Pneumonia, unspecified organism: J18.9

## 2024-11-18 HISTORY — DX: Headache, unspecified: R51.9

## 2024-11-18 HISTORY — DX: Anxiety disorder, unspecified: F41.9

## 2024-11-18 HISTORY — DX: Dyspnea, unspecified: R06.00

## 2024-11-18 NOTE — Patient Instructions (Addendum)
 Your procedure is scheduled on: 11/25/24 - Friday Report to the Registration Desk on the 1st floor of the Medical Mall. To find out your arrival time, please call [phone] between 1PM - 3PM on: 11/24/24 - Thursday If your arrival time is 6:00 am, do not arrive before that time as the Medical Mall entrance doors do not open until 6:00 am.  REMEMBER: Instructions that are not followed completely may result in serious medical risk, up to and including death; or upon the discretion of your surgeon and anesthesiologist your surgery may need to be rescheduled.  Do not eat food or drink any liquids after midnight the night before surgery.  No gum chewing or hard candies.  One week prior to surgery: Stop Anti-inflammatories (NSAIDS) such as Advil , Aleve, Ibuprofen , Motrin , Naproxen, Naprosyn and Aspirin based products such as Excedrin, Goody's Powder, BC Powder. You may continue to take Tylenol if needed for pain up until the day of surgery.  Stop ANY OVER THE COUNTER supplements until after surgery.  ON THE DAY OF SURGERY ONLY TAKE THESE MEDICATIONS WITH SIPS OF WATER:  budesonide-formoterol (SYMBICORT)    Use inhalers on the day of surgery and bring to the hospital.  No Alcohol for 24 hours before or after surgery.  No Smoking including e-cigarettes for 24 hours before surgery.  No chewable tobacco products for at least 6 hours before surgery.  No nicotine patches on the day of surgery.  Do not use any recreational drugs for at least a week (preferably 2 weeks) before your surgery.  Please be advised that the combination of cocaine and anesthesia may have negative outcomes, up to and including death. If you test positive for cocaine, your surgery will be cancelled.  On the morning of surgery brush your teeth with toothpaste and water, you may rinse your mouth with mouthwash if you wish. Do not swallow any toothpaste or mouthwash.  Do not wear jewelry, make-up, hairpins, clips or  nail polish.  For welded (permanent) jewelry: bracelets, anklets, waist bands, etc.  Please have this removed prior to surgery.  If it is not removed, there is a chance that hospital personnel will need to cut it off on the day of surgery.  Do not wear lotions, powders, or perfumes.   Do not shave body hair from the neck down 48 hours before surgery.  Contact lenses, hearing aids and dentures may not be worn into surgery.  Do not bring valuables to the hospital. [facility] is not responsible for any missing/lost belongings or valuables.   Notify your doctor if there is any change in your medical condition (cold, fever, infection).  Wear comfortable clothing (specific to your surgery type) to the hospital.  After surgery, you can help prevent lung complications by doing breathing exercises.  Take deep breaths and cough every 1-2 hours. Your doctor may order a device called an Incentive Spirometer to help you take deep breaths.  If you are being admitted to the hospital overnight, leave your suitcase in the car. After surgery it may be brought to your room.  In case of increased patient census, it may be necessary for you, the patient, to continue your postoperative care in the Same Day Surgery department.  If you are being discharged the day of surgery, you will not be allowed to drive home. You will need a responsible individual to drive you home and stay with you for 24 hours after surgery.   If you are taking public transportation, you  will need to have a responsible individual with you.  Please call the Pre-admissions Testing Dept. at [phone] if you have any questions about these instructions.  Surgery Visitation Policy:  Patients having surgery or a procedure may have two visitors.  Children under the age of 64 must have an adult with them who is not the patient.  Inpatient Visitation:    Visiting hours are 7 a.m. to 8 p.m. Up to four visitors are allowed at one time  in a patient room. The visitors may rotate out with other people during the day.  One visitor age 89 or older may stay with the patient overnight and must be in the room by 8 p.m.   Merchandiser, Retail to address health-related social needs:  [email or web address]

## 2024-11-18 NOTE — Pre-Procedure Instructions (Signed)
 Per Dr. Aleskerov , he recommended patient be seen at [facility] now, Mom made aware by this writer.

## 2024-11-18 NOTE — Pre-Procedure Instructions (Signed)
 Mom reports that patient breathing has worsened and wants him to get a chest xray, MD made ware.This clinical research associate voiced that if his condition worsens to seek help, call MD.

## 2024-11-24 NOTE — Anesthesia Preprocedure Evaluation (Signed)
"                                    Anesthesia Evaluation  Patient identified by MRN, date of birth, ID band Patient awake    Reviewed: Allergy & Precautions, H&P , NPO status , Patient's Chart, lab work & pertinent test results, reviewed documented beta blocker date and time   History of Anesthesia Complications Negative for: history of anesthetic complications  Airway Mallampati: I  TM Distance: >3 FB Neck ROM: full    Dental  (+) Dental Advidsory Given, Chipped, Missing, Teeth Intact   Pulmonary shortness of breath, asthma , neg COPD, neg recent URI, Patient abstained from smoking.   Pulmonary exam normal breath sounds clear to auscultation       Cardiovascular Exercise Tolerance: Good negative cardio ROS Normal cardiovascular exam Rhythm:regular Rate:Normal     Neuro/Psych  PSYCHIATRIC DISORDERS Anxiety     negative neurological ROS  negative psych ROS   GI/Hepatic Neg liver ROS,GERD  Controlled,,  Endo/Other  negative endocrine ROS    Renal/GU negative Renal ROS  negative genitourinary   Musculoskeletal   Abdominal   Peds  Hematology negative hematology ROS (+)   Anesthesia Other Findings   Mucus plugging of bronchi   Extremely fatigued Severe persistent asthma without complication  Chronic diarrhea  Past Medical History: No date: Anxiety No date: Asthma No date: Depression No date: Dyspnea No date: Headache No date: Pneumonia  Past Surgical History: No date: dental procedure     Reproductive/Obstetrics negative OB ROS                              Anesthesia Physical Anesthesia Plan  ASA: 2  Anesthesia Plan: General ETT   Post-op Pain Management:    Induction: Intravenous  PONV Risk Score and Plan: 2 and Ondansetron, Dexamethasone, Midazolam and Treatment may vary due to age or medical condition  Airway Management Planned: Oral ETT  Additional Equipment:   Intra-op Plan:    Post-operative Plan: Extubation in OR  Informed Consent: I have reviewed the patients History and Physical, chart, labs and discussed the procedure including the risks, benefits and alternatives for the proposed anesthesia with the patient or authorized representative who has indicated his/her understanding and acceptance.     Dental Advisory Given  Plan Discussed with: CRNA and Surgeon  Anesthesia Plan Comments:          Anesthesia Quick Evaluation  "

## 2024-11-25 ENCOUNTER — Encounter
Admission: RE | Disposition: A | Discharge status: Home / Self Care | Payer: Self-pay | Source: Home / Self Care | Attending: Pulmonary Disease

## 2024-11-25 ENCOUNTER — Other Ambulatory Visit: Payer: Self-pay

## 2024-11-25 ENCOUNTER — Ambulatory Visit: Payer: Self-pay

## 2024-11-25 ENCOUNTER — Ambulatory Visit
Admission: RE | Admit: 2024-11-25 | Discharge: 2024-11-25 | Disposition: A | Discharge status: Home / Self Care | Attending: Pulmonary Disease | Admitting: Pulmonary Disease

## 2024-11-25 ENCOUNTER — Ambulatory Visit

## 2024-11-25 ENCOUNTER — Encounter: Payer: Self-pay | Admitting: Pulmonary Disease

## 2024-11-25 DIAGNOSIS — J45909 Unspecified asthma, uncomplicated: Secondary | ICD-10-CM | POA: Diagnosis not present

## 2024-11-25 DIAGNOSIS — J189 Pneumonia, unspecified organism: Secondary | ICD-10-CM | POA: Diagnosis not present

## 2024-11-25 DIAGNOSIS — R0609 Other forms of dyspnea: Secondary | ICD-10-CM | POA: Diagnosis present

## 2024-11-25 DIAGNOSIS — F17290 Nicotine dependence, other tobacco product, uncomplicated: Secondary | ICD-10-CM | POA: Diagnosis not present

## 2024-11-25 DIAGNOSIS — K219 Gastro-esophageal reflux disease without esophagitis: Secondary | ICD-10-CM | POA: Diagnosis not present

## 2024-11-25 DIAGNOSIS — T17500A Unspecified foreign body in bronchus causing asphyxiation, initial encounter: Secondary | ICD-10-CM | POA: Diagnosis not present

## 2024-11-25 MED ORDER — CHLORHEXIDINE GLUCONATE 0.12 % MT SOLN
15.0000 mL | Freq: Once | OROMUCOSAL | Status: AC
  Administered 2024-11-25: 15 mL via OROMUCOSAL

## 2024-11-25 MED ORDER — LACTATED RINGERS IV SOLN: INTRAVENOUS | Status: DC

## 2024-11-25 MED ORDER — ONDANSETRON HCL 4 MG/2ML IJ SOLN
INTRAMUSCULAR | Status: DC | PRN
  Administered 2024-11-25: 4 mg via INTRAVENOUS

## 2024-11-25 MED ORDER — PROPOFOL 10 MG/ML IV BOLUS
INTRAVENOUS | Status: DC | PRN
  Administered 2024-11-25: 30 mg via INTRAVENOUS
  Administered 2024-11-25: 50 mg via INTRAVENOUS
  Administered 2024-11-25: 200 mg via INTRAVENOUS

## 2024-11-25 MED ORDER — FENTANYL CITRATE (PF) 100 MCG/2ML IJ SOLN
INTRAMUSCULAR | Status: DC | PRN
  Administered 2024-11-25 (×2): 50 ug via INTRAVENOUS

## 2024-11-25 MED ORDER — FENTANYL CITRATE (PF) 100 MCG/2ML IJ SOLN: 25.0000 ug | INTRAMUSCULAR | Status: DC | PRN

## 2024-11-25 MED ORDER — DEXAMETHASONE SOD PHOSPHATE PF 10 MG/ML IJ SOLN
INTRAMUSCULAR | Status: AC
  Filled 2024-11-25: qty 1

## 2024-11-25 MED ORDER — CHLORHEXIDINE GLUCONATE 0.12 % MT SOLN
OROMUCOSAL | Status: AC
  Filled 2024-11-25: qty 15

## 2024-11-25 MED ORDER — SUCCINYLCHOLINE CHLORIDE 200 MG/10ML IV SOSY
PREFILLED_SYRINGE | INTRAVENOUS | Status: DC | PRN
  Administered 2024-11-25: 140 mg via INTRAVENOUS

## 2024-11-25 MED ORDER — ONDANSETRON HCL 4 MG/2ML IJ SOLN
INTRAMUSCULAR | Status: AC
  Filled 2024-11-25: qty 2

## 2024-11-25 MED ORDER — PROPOFOL 10 MG/ML IV BOLUS
INTRAVENOUS | Status: AC
  Filled 2024-11-25: qty 20

## 2024-11-25 MED ORDER — LIDOCAINE HCL (PF) 2 % IJ SOLN
INTRAMUSCULAR | Status: AC
  Filled 2024-11-25: qty 5

## 2024-11-25 MED ORDER — ORAL CARE MOUTH RINSE: 15.0000 mL | Freq: Once | OROMUCOSAL | Status: AC

## 2024-11-25 MED ORDER — FENTANYL CITRATE (PF) 100 MCG/2ML IJ SOLN
INTRAMUSCULAR | Status: AC
  Filled 2024-11-25: qty 2

## 2024-11-25 MED ORDER — MIDAZOLAM HCL (PF) 2 MG/2ML IJ SOLN
INTRAMUSCULAR | Status: DC | PRN
  Administered 2024-11-25: 2 mg via INTRAVENOUS

## 2024-11-25 MED ORDER — MIDAZOLAM HCL 2 MG/2ML IJ SOLN
INTRAMUSCULAR | Status: AC
  Filled 2024-11-25: qty 2

## 2024-11-25 MED ORDER — LIDOCAINE HCL (CARDIAC) PF 100 MG/5ML IV SOSY
PREFILLED_SYRINGE | INTRAVENOUS | Status: DC | PRN
  Administered 2024-11-25: 50 mg via INTRAVENOUS

## 2024-11-25 MED ORDER — LACTATED RINGERS IV SOLN: INTRAVENOUS | Status: DC | PRN

## 2024-11-25 MED ORDER — DROPERIDOL 2.5 MG/ML IJ SOLN: 0.6250 mg | Freq: Once | INTRAMUSCULAR | Status: DC | PRN

## 2024-11-25 MED ORDER — DEXAMETHASONE SOD PHOSPHATE PF 10 MG/ML IJ SOLN
INTRAMUSCULAR | Status: DC | PRN
  Administered 2024-11-25: 5 mg via INTRAVENOUS

## 2024-11-25 MED ORDER — SUCCINYLCHOLINE CHLORIDE 200 MG/10ML IV SOSY
PREFILLED_SYRINGE | INTRAVENOUS | Status: AC
  Filled 2024-11-25: qty 10

## 2024-11-25 NOTE — Procedures (Signed)
" °  PROCEDURE: BRONCHOSCOPY Therapeutic Aspiration of Tracheobronchial Tree and Bronchoalveolar lavage  PROCEDURE DATE: 11/25/2024  TIME:  NAME:  Ricardo Gibson  DOB:Jan 08, 2001  MRN: [id number] LOC:  [facility]/None    HOSP DAY: @LENGTHOFSTAYDAYS @ CODE STATUS:        Indications/Preliminary Diagnosis:   Consent: (Place X beside choice/s below)  The benefits, risks and possible complications of the procedure were        explained to:  __x_ patient  ___ patient's family  ___ other:___________  who verbalized understanding and gave:  ___ verbal  __x_ written  ___ verbal and written  ___ telephone  ___ other:________ consent.      Unable to obtain consent; procedure performed on emergent basis.     Other:       PRESEDATION ASSESSMENT: History and Physical has been performed. Patient meds and allergies have been reviewed. Presedation airway examination has been performed and documented. Baseline vital signs, sedation score, oxygenation status, and cardiac rhythm were reviewed. Patient was deemed to be in satisfactory condition to undergo the procedure.      PROCEDURE DETAILS: Timeout performed and correct patient, name, & ID confirmed. Following prep per Pulmonary policy, appropriate sedation was administered. The Bronchoscope was inserted in to oral cavity with bite block in place. Therapeutic aspiration of Tracheobronchial tree was performed.  Airway exam proceeded with findings, technical procedures, and specimen collection as noted below. At the end of exam the scope was withdrawn without incident. Impression and Plan as noted below.           Airway Prep (Place X beside choice below)   1% Transtracheal Lidocaine Anesthetization 7 cc   Patient prepped per Bronchoscopy Lab Policy       Insertion Route (Place X beside choice below)   Nasal   Oral  x Endotracheal Tube   Tracheostomy    Medication Amt Dose  Medication Amt Dose  Lidocaine 1%  cc  Epinephrine 1:10,000 sol  cc   Xylocaine 4%  cc  Cocaine  cc   TECHNICAL PROCEDURES: (Place X beside choice below)   Procedures  Description    None     Electrocautery     Cryotherapy     Balloon Dilatation     Bronchography     Stent Placement   x  Therapeutic Aspiration Right lower lobe and lingula    Laser/Argon Plasma    Brachytherapy Catheter Placement    Foreign Body Removal         SPECIMENS (Sites): (Place X beside choice below)  Specimens Description   No Specimens Obtained     Washings   x Lavage Lingula   Biopsies    Fine Needle Aspirates    Brushings    Sputum    FINDINGS:   Mild mucus plugging, airways bilaterally looked normal with pitting, non friable mucosa no cobblestoning.  No exophytics lesions.  Inferior lingula had copious amounts of mucus which was inspissated. Lingular BAL was done and return appear mucoid.  There were numerous strings of dry green debri that were noted in BAL container grossly.  ESTIMATED BLOOD LOSS: none COMPLICATIONS/RESOLUTION: none      Liam Cammarata, M.D.  Pulmonary & Critical Care Medicine  [facility]     "

## 2024-11-25 NOTE — Transfer of Care (Signed)
 Immediate Anesthesia Transfer of Care Note  Patient: Ricardo Gibson  Procedure(s) Performed: BRONCHOSCOPY, FLEXIBLE IRRIGATION, BRONCHUS  Patient Location: PACU  Anesthesia Type:General  Level of Consciousness: drowsy  Airway & Oxygen Therapy: Patient Spontanous Breathing and Patient connected to face mask oxygen  Post-op Assessment: Report given to RN, Post -op Vital signs reviewed and stable, and Patient moving all extremities X 4  Post vital signs: Reviewed and stable  Last Vitals:  Vitals Value Taken Time  BP 101/46 11/25/24 13:37  Temp 36.6 C 11/25/24 13:37  Pulse 86 11/25/24 13:37  Resp 22 11/25/24 13:37  SpO2 99 % 11/25/24 13:37  Vitals shown include unfiled device data.  Last Pain:  Vitals:   11/25/24 1337  TempSrc:   PainSc: Asleep         Complications: No notable events documented.

## 2024-11-25 NOTE — H&P (Signed)
 "    PULMONOLOGY         Date: 11/25/2024,   MRN# [id number] Ricardo Gibson June 22, 2001     AdmissionWeight: 56.7 kg                 CurrentWeight: 56.7 kg  Referring provider: Dr Theotis   CHIEF COMPLAINT:   Severe dyspnea on exertion   HISTORY OF PRESENT ILLNESS   This is a young 24 year old male patient and I know from outpatient pulmonology clinic who has had progressive dyspnea with minimal exertion over a year now.  Patient describes severe and progressive dyspnea which has been ongoing for over the past year causing him to lose significant weight with now BMI less than 18.  He has had wheezing and cough and we have done extensive workup for allergies and asthma and treated him with inhaled medications including ICS/LAMA/LABA, Singulair, prednisone, nebulizer therapy, biologic dupilumab and still he continues to have recurrent wheezing with dyspnea and cough.  We have performed pulmonary function tests and patient has reduced spirometry with an FEV1 proximately 65% with reduced DLCO around 70%.  Patient does not have a history of autoimmune conditions and we have done serology for autoimmune conditions which was negative including alpha 1 antitrypsin, ANA, mold profile, Myo marker, ANCA panel, connective tissue disease workup, hypersensitivity pneumonitis workup, allergy panel, ESR CRP thyroid profile, viral workup, respiratory cultures.  Patient underwent CT chest with PE protocol to rule out pulmonary venous thromboembolism and reviewed lung parenchyma in detail with, this was done August 2025.  Findings include peribronchial nodularity within the right upper lobe and scattered tree-in-bud nodularity in both lower lobes as well as a nodule of 4-1/2 mm in the left upper lobe.  There was no pulmonary embolism.  Radiology report includes possible infectious etiology.  Patient is here today for bronchoscopic airway examination due to ongoing progressive dyspnea, planning on performing  diagnostic flexible bronchoscopy with therapeutic aspiration of tracheobronchial tree and bronchoalveolar lavage for microbiology cell count and differential and possible biopsies of any noted exophytic lesions.   PAST MEDICAL HISTORY   Past Medical History:  Diagnosis Date   Anxiety    Asthma    Depression    Dyspnea    Headache    Pneumonia      SURGICAL HISTORY   Past Surgical History:  Procedure Laterality Date   dental procedure       FAMILY HISTORY   History reviewed. No pertinent family history.   SOCIAL HISTORY   Social History[1]   MEDICATIONS    Home Medication:    Current Medication: Current Medications[2]    ALLERGIES   Peanut (diagnostic), Peanut-containing drug products, Penicillins, and Shrimp (diagnostic)     REVIEW OF SYSTEMS    Review of Systems:  Gen:  Denies  fever, sweats, chills weigh loss  HEENT: Denies blurred vision, double vision, ear pain, eye pain, hearing loss, nose bleeds, sore throat Cardiac:  No dizziness, chest pain or heaviness, chest tightness,edema Resp:   reports dyspnea chronically  Gi: Denies swallowing difficulty, stomach pain, nausea or vomiting, diarrhea, constipation, bowel incontinence Gu:  Denies bladder incontinence, burning urine Ext:   Denies Joint pain, stiffness or swelling Skin: Denies  skin rash, easy bruising or bleeding or hives Endoc:  Denies polyuria, polydipsia , polyphagia or weight change Psych:   Denies depression, insomnia or hallucinations   Other:  All other systems negative   VS: BP 105/82   Pulse 72   Temp 98.1  F (36.7 C) (Tympanic)   Resp 18   Ht 5' 10 (1.778 m)   Wt 56.7 kg   SpO2 100%   BMI 17.94 kg/m      PHYSICAL EXAM    GENERAL:NAD, no fevers, chills, no weakness no fatigue HEAD: Normocephalic, atraumatic.  EYES: Pupils equal, round, reactive to light. Extraocular muscles intact. No scleral icterus.  MOUTH: Moist mucosal membrane. Dentition intact. No  abscess noted.  EAR, NOSE, THROAT: Clear without exudates. No external lesions.  NECK: Supple. No thyromegaly. No nodules. No JVD.  PULMONARY: decreased breath sounds with mild rhonchi worse at bases bilaterally.  CARDIOVASCULAR: S1 and S2. Regular rate and rhythm. No murmurs, rubs, or gallops. No edema. Pedal pulses 2+ bilaterally.  GASTROINTESTINAL: Soft, nontender, nondistended. No masses. Positive bowel sounds. No hepatosplenomegaly.  MUSCULOSKELETAL: No swelling, clubbing, or edema. Range of motion full in all extremities.  NEUROLOGIC: Cranial nerves II through XII are intact. No gross focal neurological deficits. Sensation intact. Reflexes intact.  SKIN: No ulceration, lesions, rashes, or cyanosis. Skin warm and dry. Turgor intact.  PSYCHIATRIC: Mood, affect within normal limits. The patient is awake, alert and oriented x 3. Insight, judgment intact.       IMAGING   CLINICAL DATA:  Dyspnea and respiratory abnormalities   EXAM: CT ANGIOGRAPHY CHEST WITH CONTRAST   TECHNIQUE: Multidetector CT imaging of the chest was performed using the standard protocol during bolus administration of intravenous contrast. Multiplanar CT image reconstructions and MIPs were obtained to evaluate the vascular anatomy.   RADIATION DOSE REDUCTION: This exam was performed according to the departmental dose-optimization program which includes automated exposure control, adjustment of the mA and/or kV according to patient size and/or use of iterative reconstruction technique.   CONTRAST:  75mL OMNIPAQUE  IOHEXOL  350 MG/ML SOLN   COMPARISON:  November 06, 2023   FINDINGS: Pulmonary Embolism: The subsegmental branches of the pulmonary arteries are degraded by motion and timing of the contrast bolus (transient interruption of the contrast bolus). Otherwise, no pulmonary embolism visualized in the remaining pulmonary arteries.   Cardiovascular: No cardiomegaly or pericardial effusion.No  aortic aneurysm.   Mediastinum/Nodes: No mediastinal mass.No mediastinal, hilar, or axillary lymphadenopathy.   Lungs/Pleura: The midline trachea and bronchi are patent. Peribronchial nodularity within the right upper lobe. Scattered tree-in-bud nodularity present in both lower lobes and dependently in the left upper lobe. The largest nodule measures 4.5 mm in the left upper lobe (axial 62). No lobar consolidation, pleural effusion, or pneumothorax.   Musculoskeletal: No acute fracture or destructive bone lesion.   Upper Abdomen: No acute abnormality in the partially visualized upper abdomen.   Review of the MIP images confirms the above findings.   IMPRESSION: 1. The subsegmental branches of the pulmonary arteries are degraded by motion and the contrast bolus. Otherwise, no pulmonary embolus visualized in the remaining pulmonary arteries. 2. Peribronchial and scattered tree-in-bud nodularity noted throughout the lungs, likely reflecting an infectious or inflammatory bronchiolitis. No lobar pneumonia or pleural effusion.     Electronically Signed   By: Rogelia Myers M.D.   On: 07/01/2024 15:56  ASSESSMENT/PLAN   Chronic dyspnea with atypical pneumonia Patient is here for bronchoscopic airway examination with therapeutic aspiration of tracheobronchial tree and bronchoalveolar lavage   -Reviewed risks/complications and benefits with patient, risks include infection, pneumothorax/pneumomediastinum which may require chest tube placement as well as overnight/prolonged hospitalization and possible mechanical ventilation. Other risks include bleeding and very rarely death.  Patient understands risks and wishes to proceed.  Additional  questions were answered, and patient is aware that post procedure patient will be going home with family and may experience cough with possible clots on expectoration as well as phlegm which may last few days as well as hoarseness of voice post intubation  and mechanical ventilation.            Thank you for allowing me to participate in the care of this patient.   Patient/Family are satisfied with care plan and all questions have been answered.    Provider disclosure: Patient with at least one acute or chronic illness or injury that poses a threat to life or bodily function and is being managed actively during this encounter.  All of the below services have been performed independently by signing provider:  review of prior documentation from internal and or external health records.  Review of previous and current lab results.  Interview and comprehensive assessment during patient visit today. Review of current and previous chest radiographs/CT scans. Discussion of management and test interpretation with health care team and patient/family.   This document was prepared using Dragon voice recognition software and may include unintentional dictation errors.     Monasia Lair, M.D.  Division of Pulmonary & Critical Care Medicine             [1]  Social History Tobacco Use   Smoking status: Never    Passive exposure: Never   Smokeless tobacco: Never  Vaping Use   Vaping status: Every Day   Substances: CBD  Substance Use Topics   Alcohol use: Yes    Comment: rare   Drug use: Yes    Types: Marijuana    Comment: weekly, CBD gummies  [2]  Current Facility-Administered Medications:    lactated ringers infusion, , Intravenous, Continuous, Myra Lynwood MATSU, MD, Last Rate: 10 mL/hr at 11/25/24 1030, New Bag at 11/25/24 1030  "

## 2024-11-25 NOTE — Anesthesia Procedure Notes (Signed)
 Procedure Name: Intubation Date/Time: 11/25/2024 1:00 PM  Performed by: Myra Lawless, CRNAPre-anesthesia Checklist: Patient identified, Patient being monitored, Timeout performed, Emergency Drugs available and Suction available Patient Re-evaluated:Patient Re-evaluated prior to induction Oxygen Delivery Method: Circle system utilized Preoxygenation: Pre-oxygenation with 100% oxygen Induction Type: IV induction Ventilation: Mask ventilation without difficulty Laryngoscope Size: Mac and 3 Grade View: Grade I Tube type: Oral Tube size: 8.0 mm Number of attempts: 1 Airway Equipment and Method: Stylet Placement Confirmation: ETT inserted through vocal cords under direct vision, positive ETCO2 and breath sounds checked- equal and bilateral Secured at: 21 cm Tube secured with: Tape Dental Injury: Teeth and Oropharynx as per pre-operative assessment

## 2024-11-26 LAB — BODY FLUID CELL COUNT WITH DIFFERENTIAL
Eos, Fluid: 0 %
Lymphs, Fluid: 3 %
Monocyte-Macrophage-Serous Fluid: 0 % — ABNORMAL LOW (ref 50–90)
Neutrophil Count, Fluid: 97 % — ABNORMAL HIGH (ref 0–25)
Total Nucleated Cell Count, Fluid: 560 uL (ref 0–1000)

## 2024-11-27 LAB — CULTURE, BAL-QUANTITATIVE W GRAM STAIN: Culture: 1000 — AB

## 2024-11-28 ENCOUNTER — Encounter: Payer: Self-pay | Admitting: Pulmonary Disease

## 2024-11-29 LAB — ACID FAST SMEAR (AFB, MYCOBACTERIA): Acid Fast Smear: NEGATIVE

## 2024-12-05 LAB — VIRUS CULTURE

## 2024-12-09 LAB — CULTURE, FUNGUS WITHOUT SMEAR
